# Patient Record
Sex: Female | Born: 1950 | Race: White | Hispanic: No | Marital: Married | Smoking: Current every day smoker
Health system: Southern US, Community
[De-identification: ages and names within clinical notes are randomized; demographics above are authoritative.]

## PROBLEM LIST (undated history)

## (undated) ENCOUNTER — Ambulatory Visit

## (undated) DIAGNOSIS — Z9889 Other specified postprocedural states: Secondary | ICD-10-CM

## (undated) DIAGNOSIS — M810 Age-related osteoporosis without current pathological fracture: Secondary | ICD-10-CM

## (undated) DIAGNOSIS — F3341 Major depressive disorder, recurrent, in partial remission: Secondary | ICD-10-CM

## (undated) DIAGNOSIS — Z961 Presence of intraocular lens: Secondary | ICD-10-CM

## (undated) DIAGNOSIS — C50919 Malignant neoplasm of unspecified site of unspecified female breast: Secondary | ICD-10-CM

## (undated) DIAGNOSIS — E782 Mixed hyperlipidemia: Secondary | ICD-10-CM

## (undated) DIAGNOSIS — H3530 Unspecified macular degeneration: Secondary | ICD-10-CM

## (undated) DIAGNOSIS — E8881 Metabolic syndrome: Secondary | ICD-10-CM

## (undated) DIAGNOSIS — E559 Vitamin D deficiency, unspecified: Secondary | ICD-10-CM

## (undated) DIAGNOSIS — H17813 Minor opacity of cornea, bilateral: Secondary | ICD-10-CM

## (undated) DIAGNOSIS — K219 Gastro-esophageal reflux disease without esophagitis: Secondary | ICD-10-CM

## (undated) DIAGNOSIS — Z1379 Encounter for other screening for genetic and chromosomal anomalies: Secondary | ICD-10-CM

## (undated) DIAGNOSIS — K580 Irritable bowel syndrome with diarrhea: Secondary | ICD-10-CM

## (undated) DIAGNOSIS — Z720 Tobacco use: Secondary | ICD-10-CM

## (undated) HISTORY — PX: ABDOMINAL HYSTERECTOMY: SHX81

## (undated) HISTORY — DX: Major depressive disorder, recurrent, in partial remission: F33.41

## (undated) HISTORY — DX: Gastro-esophageal reflux disease without esophagitis: K21.9

## (undated) HISTORY — DX: Malignant neoplasm of unspecified site of unspecified female breast: C50.919

## (undated) HISTORY — DX: Metabolic syndrome: E88.810

## (undated) HISTORY — DX: Age-related osteoporosis without current pathological fracture: M81.0

## (undated) HISTORY — DX: Irritable bowel syndrome with diarrhea: K58.0

## (undated) HISTORY — DX: Mixed hyperlipidemia: E78.2

## (undated) HISTORY — DX: Metabolic syndrome: E88.81

## (undated) HISTORY — DX: Vitamin D deficiency, unspecified: E55.9

## (undated) HISTORY — DX: Tobacco use: Z72.0

---
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
## 1994-03-21 HISTORY — PX: BREAST LUMPECTOMY: SHX2

## 2001-03-21 HISTORY — PX: APPENDECTOMY: SHX54

## 2004-08-31 HISTORY — PX: DESCEMETS STRIPPING AUTOMATED ENDOTHELIAL KERATOPLASTY: SHX1454

## 2004-12-14 HISTORY — PX: DESCEMETS STRIPPING AUTOMATED ENDOTHELIAL KERATOPLASTY: SHX1454

## 2007-03-22 DIAGNOSIS — C50919 Malignant neoplasm of unspecified site of unspecified female breast: Secondary | ICD-10-CM

## 2007-03-22 HISTORY — DX: Malignant neoplasm of unspecified site of unspecified female breast: C50.919

## 2014-05-15 ENCOUNTER — Telehealth: Payer: Self-pay | Admitting: Genetic Counselor

## 2014-05-15 NOTE — Telephone Encounter (Signed)
Pt wants to confirm that insurance will cover testing before scheduling and will call back to schedule appt.

## 2014-06-11 ENCOUNTER — Ambulatory Visit (HOSPITAL_BASED_OUTPATIENT_CLINIC_OR_DEPARTMENT_OTHER): Payer: 59 | Admitting: Genetic Counselor

## 2014-06-11 ENCOUNTER — Other Ambulatory Visit: Payer: 59

## 2014-06-11 ENCOUNTER — Encounter: Payer: Self-pay | Admitting: Genetic Counselor

## 2014-06-11 DIAGNOSIS — C50919 Malignant neoplasm of unspecified site of unspecified female breast: Secondary | ICD-10-CM | POA: Insufficient documentation

## 2014-06-11 DIAGNOSIS — Z800 Family history of malignant neoplasm of digestive organs: Secondary | ICD-10-CM

## 2014-06-11 DIAGNOSIS — Z8041 Family history of malignant neoplasm of ovary: Secondary | ICD-10-CM | POA: Diagnosis not present

## 2014-06-11 DIAGNOSIS — Z853 Personal history of malignant neoplasm of breast: Secondary | ICD-10-CM

## 2014-06-11 DIAGNOSIS — Z803 Family history of malignant neoplasm of breast: Secondary | ICD-10-CM

## 2014-06-11 DIAGNOSIS — Z315 Encounter for genetic counseling: Secondary | ICD-10-CM

## 2014-06-11 NOTE — Progress Notes (Signed)
REFERRING PROVIDER: Hosie Poisson, MD  PRIMARY PROVIDER:  No primary care provider on file.  PRIMARY REASON FOR VISIT:  1. History of breast cancer   2. Family history of breast cancer   3. Family history of ovarian cancer   4. Family history of colon cancer      HISTORY OF PRESENT ILLNESS:   Rachel Mcgee, a 64 y.o. female, was seen for a [place] [place] cancer genetics consultation at the request of Dr. Hinton Rao due to a personal and family history of cancer.  Rachel Mcgee presents to clinic today to discuss the possibility of a hereditary predisposition to cancer, genetic testing, and to further clarify her future cancer risks, as well as potential cancer risks for family members.   In 2008, at the age of 64, Rachel Mcgee was diagnosed with invasive ductal carcinoma of the breast.  The tumor was ER+/PR-/Her2+. This was treated with surgery, chemotherapy and radiation.  Nobody in the family is reported to have had genetic testing.    CANCER HISTORY:   No history exists.     HORMONAL Mcgee FACTORS:  Menarche was at age 10.  First live birth at age 41.  OCP use for approximately 5-10 years.  Ovaries intact: yes.  Hysterectomy: no.  Menopausal status: postmenopausal.  HRT use: 0 years. Colonoscopy: yes; normal. Mammogram within the last year: yes. Number of breast biopsies: 1. Up to date with pelvic exams:  yes. Any excessive radiation exposure in the past:  Previous breast cancer treatment.  Past Medical History  Diagnosis Date  . Breast cancer 2009    Past Surgical History  Procedure Laterality Date  . Abdominal hysterectomy      partial hysterectomy at age 89    History   Social History  . Marital Status: Married    Spouse Name: N/A  . Number of Children: N/A  . Years of Education: N/A   Social History Main Topics  . Smoking status: Former Smoker -- 1.00 packs/day for 30 years    Types: Cigarettes  . Smokeless tobacco: Not on file  . Alcohol Use: No  . Drug  Use: No  . Sexual Activity: Not on file   Other Topics Concern  . None   Social History Narrative  . None     FAMILY HISTORY:  We obtained a detailed, 4-generation family history.  Significant diagnoses are listed below: Family History  Problem Relation Age of Onset  . Stroke Father   . Ovarian cancer Sister 74  . Breast cancer Maternal Aunt     dx in her 62s  . Colon cancer Maternal Uncle     dx in late 92s  . Ovarian cancer Paternal Aunt     dx in her 18s  . Melanoma Paternal Uncle   . Stroke Maternal Grandmother   . Colon cancer Paternal Grandmother 106  . Brain cancer Paternal Uncle   . Breast cancer Cousin 30  . Ovarian cancer Cousin     dx in her 63s   The patient has one son who is healthy, and a sister who was diagnosed with ovarian cancer at age 39.  Rachel Mcgee mother died at 97 with dementia, and her father is alive at 41.  He had two sisters and a brother.  One sister had ovarian cancer, one sister had a benign brain tumor, and the brother had melanoma.  A paternal cousin had breast cancer at age 53 and died of ovarian cancer in her 63s.  [name]'s paternal grandmother had colon cancer.  Rachel Mcgee's mother had three brothers and two sisters.  One brother had colon cancer in his 20s and one sister had breast cancer.  The brother had a daughter with breast cancer in her 46s.   Patient's maternal ancestors are of Caucasian descent, and paternal ancestors are of Caucasian descent. There is no reported Ashkenazi Jewish ancestry. There is no known consanguinity.  GENETIC COUNSELING ASSESSMENT: Rachel Mcgee is a 64 y.o. female with a personal and family history of cancer which somewhat suggestive of a hereditary cancer syndrome and predisposition to cancer. We, therefore, discussed and recommended the following at today's visit.   DISCUSSION: We reviewed the characteristics, features and inheritance patterns of hereditary cancer syndromes. Hereditary cancer  syndromes were reviewed, including BRCA mutation, CHEK2 mutations, as well as other hereditary genes associated with both breast and ovarian cancer syndromes.  We also discussed genetic testing, including the appropriate family members to test, the process of testing, insurance coverage and turn-around-time for results. We discussed the implications of a negative, positive and/or variant of uncertain significant result.   Based on Rachel Mcgee's family history, if she is negative we could be reassuring that she would not be at increased Mcgee for other cancers based on the testing of these genes.  However, there is a rare chance that she is a phenocopy, and therefore her sister should consider testing, regardless of Rachel Mcgee's results.  We recommended Rachel Mcgee pursue genetic testing for the Breast/Ovarian cancer gene panel. The Breast/Ovarian gene panel offered by [facility] includes sequencing and rearrangement analysis for the following 21 genes:  ATM, BARD1, BRCA1, BRCA2, BRIP1, CDH1, CHEK2, EPCAM, FANCC, MLH1, MSH2, MSH6, NBN, PALB2, PMS2, PTEN, RAD51C, RAD51D, STK11, TP53, and XRCC2.     PLAN: After considering the risks, benefits, and limitations, Rachel Mcgee  provided informed consent to pursue genetic testing and the blood sample was sent to [facility] for analysis of the Breast/Ovarian Cancer gene panel. Results should be available within approximately 2-3 weeks' time, at which point they will be disclosed by telephone to Rachel Mcgee, as will any additional recommendations warranted by these results. Rachel Mcgee will receive a summary of her genetic counseling visit and a copy of her results once available. This information will also be available in Epic. We encouraged Rachel Mcgee to remain in contact with cancer genetics annually so that we can continuously update the family history and inform her of any changes in cancer genetics and testing that may be of benefit for her family. [name] questions were answered to her satisfaction today. Our contact information was provided should additional questions or concerns arise.  Based on Rachel Mcgee's family history, we recommended her sister, who was diagnosed with ovarian cancer at age 63, have genetic counseling and testing. Rachel Mcgee will let us know if we can be of any assistance in coordinating genetic counseling and/or testing for this family member.   Lastly, we encouraged Rachel Mcgee to remain in contact with cancer genetics annually so that we can continuously update the family history and inform her of any changes in cancer genetics and testing that may be of benefit for this family.   Ms.  Mceachern questions were answered to her satisfaction today. Our contact information was provided should additional questions or concerns arise. Thank you for the referral and allowing [place] to share in the care of your patient.   Roderick Calo P. Florene Glen, MS, [facility] Certified Dietitian  [email or web address] phone: [phone]  The patient was seen for a total of 60 minutes in face-to-face genetic counseling.  This patient was discussed with Drs. Magrinat, Lindi Adie and/or Burr Medico who agrees with the above.    _______________________________________________________________________ For Office Staff:  Number of people involved in session: 2 Was an Intern/ student involved with case: yes

## 2014-06-23 ENCOUNTER — Telehealth: Payer: Self-pay | Admitting: Genetic Counselor

## 2014-06-23 ENCOUNTER — Encounter: Payer: Self-pay | Admitting: Genetic Counselor

## 2014-06-23 DIAGNOSIS — Z1379 Encounter for other screening for genetic and chromosomal anomalies: Secondary | ICD-10-CM

## 2014-06-23 HISTORY — DX: Encounter for other screening for genetic and chromosomal anomalies: Z13.79

## 2014-06-23 NOTE — Telephone Encounter (Signed)
Revealed negative genetic testing on Breast/Ovarian cancer panel

## 2015-05-12 DIAGNOSIS — Z1231 Encounter for screening mammogram for malignant neoplasm of breast: Secondary | ICD-10-CM | POA: Diagnosis not present

## 2015-05-20 DIAGNOSIS — M810 Age-related osteoporosis without current pathological fracture: Secondary | ICD-10-CM | POA: Diagnosis not present

## 2015-05-20 DIAGNOSIS — Z803 Family history of malignant neoplasm of breast: Secondary | ICD-10-CM | POA: Diagnosis not present

## 2015-05-20 DIAGNOSIS — M5416 Radiculopathy, lumbar region: Secondary | ICD-10-CM | POA: Diagnosis not present

## 2015-05-20 DIAGNOSIS — Z853 Personal history of malignant neoplasm of breast: Secondary | ICD-10-CM | POA: Diagnosis not present

## 2015-05-20 DIAGNOSIS — M545 Low back pain: Secondary | ICD-10-CM | POA: Diagnosis not present

## 2015-05-20 DIAGNOSIS — Z8041 Family history of malignant neoplasm of ovary: Secondary | ICD-10-CM

## 2015-05-26 DIAGNOSIS — Z853 Personal history of malignant neoplasm of breast: Secondary | ICD-10-CM | POA: Diagnosis not present

## 2015-05-26 DIAGNOSIS — M5126 Other intervertebral disc displacement, lumbar region: Secondary | ICD-10-CM | POA: Diagnosis not present

## 2015-05-29 DIAGNOSIS — Z853 Personal history of malignant neoplasm of breast: Secondary | ICD-10-CM | POA: Diagnosis not present

## 2015-05-29 DIAGNOSIS — M810 Age-related osteoporosis without current pathological fracture: Secondary | ICD-10-CM | POA: Diagnosis not present

## 2015-09-09 DIAGNOSIS — H524 Presbyopia: Secondary | ICD-10-CM | POA: Diagnosis not present

## 2015-09-09 DIAGNOSIS — H52223 Regular astigmatism, bilateral: Secondary | ICD-10-CM | POA: Diagnosis not present

## 2015-09-09 DIAGNOSIS — H2513 Age-related nuclear cataract, bilateral: Secondary | ICD-10-CM | POA: Diagnosis not present

## 2016-03-07 DIAGNOSIS — Z853 Personal history of malignant neoplasm of breast: Secondary | ICD-10-CM | POA: Diagnosis not present

## 2016-03-07 DIAGNOSIS — Z1389 Encounter for screening for other disorder: Secondary | ICD-10-CM | POA: Diagnosis not present

## 2016-03-07 DIAGNOSIS — Z9181 History of falling: Secondary | ICD-10-CM | POA: Diagnosis not present

## 2016-03-07 DIAGNOSIS — Z0000 Encounter for general adult medical examination without abnormal findings: Secondary | ICD-10-CM | POA: Diagnosis not present

## 2016-03-07 DIAGNOSIS — Z1211 Encounter for screening for malignant neoplasm of colon: Secondary | ICD-10-CM | POA: Diagnosis not present

## 2016-05-02 DIAGNOSIS — Z79899 Other long term (current) drug therapy: Secondary | ICD-10-CM | POA: Diagnosis not present

## 2016-05-02 DIAGNOSIS — K219 Gastro-esophageal reflux disease without esophagitis: Secondary | ICD-10-CM | POA: Diagnosis not present

## 2016-05-09 DIAGNOSIS — Z720 Tobacco use: Secondary | ICD-10-CM | POA: Diagnosis not present

## 2016-05-09 DIAGNOSIS — M810 Age-related osteoporosis without current pathological fracture: Secondary | ICD-10-CM | POA: Diagnosis not present

## 2016-05-09 DIAGNOSIS — K219 Gastro-esophageal reflux disease without esophagitis: Secondary | ICD-10-CM | POA: Diagnosis not present

## 2016-05-09 DIAGNOSIS — E782 Mixed hyperlipidemia: Secondary | ICD-10-CM | POA: Diagnosis not present

## 2016-05-09 DIAGNOSIS — F410 Panic disorder [episodic paroxysmal anxiety] without agoraphobia: Secondary | ICD-10-CM | POA: Diagnosis not present

## 2016-05-09 DIAGNOSIS — E8881 Metabolic syndrome: Secondary | ICD-10-CM | POA: Diagnosis not present

## 2016-05-23 DIAGNOSIS — Z853 Personal history of malignant neoplasm of breast: Secondary | ICD-10-CM | POA: Diagnosis not present

## 2016-05-31 DIAGNOSIS — Z86010 Personal history of colonic polyps: Secondary | ICD-10-CM | POA: Diagnosis not present

## 2016-05-31 DIAGNOSIS — Z1211 Encounter for screening for malignant neoplasm of colon: Secondary | ICD-10-CM | POA: Diagnosis not present

## 2016-06-06 DIAGNOSIS — M810 Age-related osteoporosis without current pathological fracture: Secondary | ICD-10-CM | POA: Diagnosis not present

## 2016-06-06 DIAGNOSIS — Z1231 Encounter for screening mammogram for malignant neoplasm of breast: Secondary | ICD-10-CM | POA: Diagnosis not present

## 2016-06-06 DIAGNOSIS — M8589 Other specified disorders of bone density and structure, multiple sites: Secondary | ICD-10-CM | POA: Diagnosis not present

## 2016-07-18 DIAGNOSIS — K648 Other hemorrhoids: Secondary | ICD-10-CM | POA: Diagnosis not present

## 2016-07-18 DIAGNOSIS — D122 Benign neoplasm of ascending colon: Secondary | ICD-10-CM | POA: Diagnosis not present

## 2016-07-18 DIAGNOSIS — Z853 Personal history of malignant neoplasm of breast: Secondary | ICD-10-CM | POA: Diagnosis not present

## 2016-07-18 DIAGNOSIS — Z800 Family history of malignant neoplasm of digestive organs: Secondary | ICD-10-CM | POA: Diagnosis not present

## 2016-07-18 DIAGNOSIS — D123 Benign neoplasm of transverse colon: Secondary | ICD-10-CM | POA: Diagnosis not present

## 2016-07-18 DIAGNOSIS — K5730 Diverticulosis of large intestine without perforation or abscess without bleeding: Secondary | ICD-10-CM | POA: Diagnosis not present

## 2016-07-18 DIAGNOSIS — Z1211 Encounter for screening for malignant neoplasm of colon: Secondary | ICD-10-CM | POA: Diagnosis not present

## 2016-07-18 DIAGNOSIS — K635 Polyp of colon: Secondary | ICD-10-CM | POA: Diagnosis not present

## 2016-07-18 DIAGNOSIS — D128 Benign neoplasm of rectum: Secondary | ICD-10-CM | POA: Diagnosis not present

## 2016-07-18 DIAGNOSIS — F17200 Nicotine dependence, unspecified, uncomplicated: Secondary | ICD-10-CM | POA: Diagnosis not present

## 2016-07-18 HISTORY — PX: COLONOSCOPY W/ POLYPECTOMY: SHX1380

## 2016-09-01 DIAGNOSIS — M810 Age-related osteoporosis without current pathological fracture: Secondary | ICD-10-CM | POA: Diagnosis not present

## 2016-09-01 DIAGNOSIS — Z720 Tobacco use: Secondary | ICD-10-CM | POA: Diagnosis not present

## 2016-09-01 DIAGNOSIS — F410 Panic disorder [episodic paroxysmal anxiety] without agoraphobia: Secondary | ICD-10-CM | POA: Diagnosis not present

## 2016-09-01 DIAGNOSIS — E782 Mixed hyperlipidemia: Secondary | ICD-10-CM | POA: Diagnosis not present

## 2016-09-01 DIAGNOSIS — K219 Gastro-esophageal reflux disease without esophagitis: Secondary | ICD-10-CM | POA: Diagnosis not present

## 2016-09-01 DIAGNOSIS — E8881 Metabolic syndrome: Secondary | ICD-10-CM | POA: Diagnosis not present

## 2016-09-01 DIAGNOSIS — Z6825 Body mass index (BMI) 25.0-25.9, adult: Secondary | ICD-10-CM | POA: Diagnosis not present

## 2016-09-01 DIAGNOSIS — Z1159 Encounter for screening for other viral diseases: Secondary | ICD-10-CM | POA: Diagnosis not present

## 2016-09-01 DIAGNOSIS — K580 Irritable bowel syndrome with diarrhea: Secondary | ICD-10-CM | POA: Diagnosis not present

## 2016-10-03 DIAGNOSIS — M79671 Pain in right foot: Secondary | ICD-10-CM | POA: Diagnosis not present

## 2017-02-20 DIAGNOSIS — E559 Vitamin D deficiency, unspecified: Secondary | ICD-10-CM | POA: Diagnosis not present

## 2017-02-20 DIAGNOSIS — Z1339 Encounter for screening examination for other mental health and behavioral disorders: Secondary | ICD-10-CM | POA: Diagnosis not present

## 2017-02-20 DIAGNOSIS — E8881 Metabolic syndrome: Secondary | ICD-10-CM | POA: Diagnosis not present

## 2017-02-20 DIAGNOSIS — K219 Gastro-esophageal reflux disease without esophagitis: Secondary | ICD-10-CM | POA: Diagnosis not present

## 2017-02-20 DIAGNOSIS — Z6827 Body mass index (BMI) 27.0-27.9, adult: Secondary | ICD-10-CM | POA: Diagnosis not present

## 2017-02-20 DIAGNOSIS — L570 Actinic keratosis: Secondary | ICD-10-CM | POA: Diagnosis not present

## 2017-02-20 DIAGNOSIS — K580 Irritable bowel syndrome with diarrhea: Secondary | ICD-10-CM | POA: Diagnosis not present

## 2017-02-20 DIAGNOSIS — Z79899 Other long term (current) drug therapy: Secondary | ICD-10-CM | POA: Diagnosis not present

## 2017-02-20 DIAGNOSIS — M810 Age-related osteoporosis without current pathological fracture: Secondary | ICD-10-CM | POA: Diagnosis not present

## 2017-02-20 DIAGNOSIS — Z720 Tobacco use: Secondary | ICD-10-CM | POA: Diagnosis not present

## 2017-02-20 DIAGNOSIS — E782 Mixed hyperlipidemia: Secondary | ICD-10-CM | POA: Diagnosis not present

## 2017-02-20 DIAGNOSIS — F3341 Major depressive disorder, recurrent, in partial remission: Secondary | ICD-10-CM | POA: Diagnosis not present

## 2017-05-03 DIAGNOSIS — H524 Presbyopia: Secondary | ICD-10-CM | POA: Diagnosis not present

## 2017-05-03 DIAGNOSIS — H5213 Myopia, bilateral: Secondary | ICD-10-CM | POA: Diagnosis not present

## 2017-05-03 DIAGNOSIS — H52223 Regular astigmatism, bilateral: Secondary | ICD-10-CM | POA: Diagnosis not present

## 2017-05-08 DIAGNOSIS — H353131 Nonexudative age-related macular degeneration, bilateral, early dry stage: Secondary | ICD-10-CM | POA: Diagnosis not present

## 2017-05-08 DIAGNOSIS — Z9889 Other specified postprocedural states: Secondary | ICD-10-CM

## 2017-05-08 DIAGNOSIS — H3530 Unspecified macular degeneration: Secondary | ICD-10-CM | POA: Diagnosis not present

## 2017-05-08 DIAGNOSIS — H25813 Combined forms of age-related cataract, bilateral: Secondary | ICD-10-CM | POA: Diagnosis not present

## 2017-05-08 HISTORY — DX: Other specified postprocedural states: Z98.890

## 2017-05-16 DIAGNOSIS — C44519 Basal cell carcinoma of skin of other part of trunk: Secondary | ICD-10-CM | POA: Diagnosis not present

## 2017-05-16 DIAGNOSIS — L570 Actinic keratosis: Secondary | ICD-10-CM | POA: Diagnosis not present

## 2017-05-16 DIAGNOSIS — L814 Other melanin hyperpigmentation: Secondary | ICD-10-CM | POA: Diagnosis not present

## 2017-05-16 DIAGNOSIS — D485 Neoplasm of uncertain behavior of skin: Secondary | ICD-10-CM | POA: Diagnosis not present

## 2017-05-16 DIAGNOSIS — D225 Melanocytic nevi of trunk: Secondary | ICD-10-CM | POA: Diagnosis not present

## 2017-05-16 DIAGNOSIS — L821 Other seborrheic keratosis: Secondary | ICD-10-CM | POA: Diagnosis not present

## 2017-05-30 DIAGNOSIS — Z961 Presence of intraocular lens: Secondary | ICD-10-CM

## 2017-05-30 DIAGNOSIS — H25813 Combined forms of age-related cataract, bilateral: Secondary | ICD-10-CM | POA: Diagnosis not present

## 2017-05-30 DIAGNOSIS — Z9849 Cataract extraction status, unspecified eye: Secondary | ICD-10-CM

## 2017-05-30 DIAGNOSIS — H25812 Combined forms of age-related cataract, left eye: Secondary | ICD-10-CM | POA: Diagnosis not present

## 2017-05-30 DIAGNOSIS — H5789 Other specified disorders of eye and adnexa: Secondary | ICD-10-CM | POA: Diagnosis not present

## 2017-05-30 HISTORY — PX: CATARACT EXTRACTION W/ INTRAOCULAR LENS IMPLANT: SHX1309

## 2017-05-30 HISTORY — DX: Presence of intraocular lens: Z96.1

## 2017-05-30 HISTORY — DX: Cataract extraction status, unspecified eye: Z98.49

## 2017-05-31 DIAGNOSIS — Z9889 Other specified postprocedural states: Secondary | ICD-10-CM | POA: Diagnosis not present

## 2017-05-31 DIAGNOSIS — H25811 Combined forms of age-related cataract, right eye: Secondary | ICD-10-CM | POA: Diagnosis not present

## 2017-05-31 DIAGNOSIS — Z961 Presence of intraocular lens: Secondary | ICD-10-CM | POA: Diagnosis not present

## 2017-05-31 DIAGNOSIS — Z48810 Encounter for surgical aftercare following surgery on the sense organs: Secondary | ICD-10-CM | POA: Diagnosis not present

## 2017-05-31 DIAGNOSIS — Z9842 Cataract extraction status, left eye: Secondary | ICD-10-CM | POA: Diagnosis not present

## 2017-06-13 DIAGNOSIS — H25811 Combined forms of age-related cataract, right eye: Secondary | ICD-10-CM | POA: Diagnosis not present

## 2017-06-13 HISTORY — PX: CATARACT EXTRACTION W/ INTRAOCULAR LENS IMPLANT: SHX1309

## 2017-06-14 DIAGNOSIS — Z9842 Cataract extraction status, left eye: Secondary | ICD-10-CM | POA: Diagnosis not present

## 2017-06-14 DIAGNOSIS — Z48810 Encounter for surgical aftercare following surgery on the sense organs: Secondary | ICD-10-CM | POA: Diagnosis not present

## 2017-06-14 DIAGNOSIS — Z9841 Cataract extraction status, right eye: Secondary | ICD-10-CM | POA: Diagnosis not present

## 2017-06-14 DIAGNOSIS — H3530 Unspecified macular degeneration: Secondary | ICD-10-CM | POA: Diagnosis not present

## 2017-06-14 DIAGNOSIS — Z961 Presence of intraocular lens: Secondary | ICD-10-CM | POA: Diagnosis not present

## 2017-06-28 DIAGNOSIS — Z9841 Cataract extraction status, right eye: Secondary | ICD-10-CM | POA: Diagnosis not present

## 2017-06-28 DIAGNOSIS — Z9842 Cataract extraction status, left eye: Secondary | ICD-10-CM | POA: Diagnosis not present

## 2017-06-28 DIAGNOSIS — Z48810 Encounter for surgical aftercare following surgery on the sense organs: Secondary | ICD-10-CM | POA: Diagnosis not present

## 2017-06-28 DIAGNOSIS — H3530 Unspecified macular degeneration: Secondary | ICD-10-CM | POA: Diagnosis not present

## 2017-06-28 DIAGNOSIS — Z961 Presence of intraocular lens: Secondary | ICD-10-CM | POA: Diagnosis not present

## 2017-06-29 DIAGNOSIS — C44519 Basal cell carcinoma of skin of other part of trunk: Secondary | ICD-10-CM | POA: Diagnosis not present

## 2017-06-29 DIAGNOSIS — L905 Scar conditions and fibrosis of skin: Secondary | ICD-10-CM | POA: Diagnosis not present

## 2017-08-03 DIAGNOSIS — J069 Acute upper respiratory infection, unspecified: Secondary | ICD-10-CM | POA: Diagnosis not present

## 2017-08-28 DIAGNOSIS — F3341 Major depressive disorder, recurrent, in partial remission: Secondary | ICD-10-CM | POA: Diagnosis not present

## 2017-08-28 DIAGNOSIS — Z9181 History of falling: Secondary | ICD-10-CM | POA: Diagnosis not present

## 2017-08-28 DIAGNOSIS — Z6827 Body mass index (BMI) 27.0-27.9, adult: Secondary | ICD-10-CM | POA: Diagnosis not present

## 2017-08-30 DIAGNOSIS — Z961 Presence of intraocular lens: Secondary | ICD-10-CM | POA: Diagnosis not present

## 2017-08-30 DIAGNOSIS — H3530 Unspecified macular degeneration: Secondary | ICD-10-CM | POA: Diagnosis not present

## 2017-08-30 DIAGNOSIS — Z48810 Encounter for surgical aftercare following surgery on the sense organs: Secondary | ICD-10-CM | POA: Diagnosis not present

## 2017-08-30 DIAGNOSIS — Z9841 Cataract extraction status, right eye: Secondary | ICD-10-CM | POA: Diagnosis not present

## 2017-08-30 DIAGNOSIS — Z9889 Other specified postprocedural states: Secondary | ICD-10-CM | POA: Diagnosis not present

## 2017-08-30 DIAGNOSIS — Z9842 Cataract extraction status, left eye: Secondary | ICD-10-CM | POA: Diagnosis not present

## 2017-08-31 DIAGNOSIS — E559 Vitamin D deficiency, unspecified: Secondary | ICD-10-CM | POA: Diagnosis not present

## 2017-08-31 DIAGNOSIS — E782 Mixed hyperlipidemia: Secondary | ICD-10-CM | POA: Diagnosis not present

## 2017-08-31 DIAGNOSIS — Z79899 Other long term (current) drug therapy: Secondary | ICD-10-CM | POA: Diagnosis not present

## 2017-10-16 DIAGNOSIS — Z0000 Encounter for general adult medical examination without abnormal findings: Secondary | ICD-10-CM | POA: Diagnosis not present

## 2017-10-18 DIAGNOSIS — H26491 Other secondary cataract, right eye: Secondary | ICD-10-CM | POA: Diagnosis not present

## 2017-10-18 DIAGNOSIS — H3530 Unspecified macular degeneration: Secondary | ICD-10-CM

## 2017-10-18 DIAGNOSIS — Z9841 Cataract extraction status, right eye: Secondary | ICD-10-CM | POA: Diagnosis not present

## 2017-10-18 DIAGNOSIS — Z48810 Encounter for surgical aftercare following surgery on the sense organs: Secondary | ICD-10-CM | POA: Diagnosis not present

## 2017-10-18 DIAGNOSIS — H26493 Other secondary cataract, bilateral: Secondary | ICD-10-CM | POA: Diagnosis not present

## 2017-10-18 DIAGNOSIS — H353132 Nonexudative age-related macular degeneration, bilateral, intermediate dry stage: Secondary | ICD-10-CM | POA: Diagnosis not present

## 2017-10-18 DIAGNOSIS — H17813 Minor opacity of cornea, bilateral: Secondary | ICD-10-CM

## 2017-10-18 DIAGNOSIS — H2640 Unspecified secondary cataract: Secondary | ICD-10-CM | POA: Diagnosis not present

## 2017-10-18 DIAGNOSIS — Z9889 Other specified postprocedural states: Secondary | ICD-10-CM | POA: Diagnosis not present

## 2017-10-18 DIAGNOSIS — Z961 Presence of intraocular lens: Secondary | ICD-10-CM | POA: Diagnosis not present

## 2017-10-18 DIAGNOSIS — Z9842 Cataract extraction status, left eye: Secondary | ICD-10-CM | POA: Diagnosis not present

## 2017-10-18 HISTORY — DX: Unspecified macular degeneration: H35.30

## 2017-10-18 HISTORY — DX: Minor opacity of cornea, bilateral: H17.813

## 2017-11-28 DIAGNOSIS — Z9889 Other specified postprocedural states: Secondary | ICD-10-CM | POA: Insufficient documentation

## 2017-11-28 HISTORY — DX: Other specified postprocedural states: Z98.890

## 2017-11-29 DIAGNOSIS — Z9889 Other specified postprocedural states: Secondary | ICD-10-CM | POA: Diagnosis not present

## 2017-11-29 DIAGNOSIS — H3530 Unspecified macular degeneration: Secondary | ICD-10-CM | POA: Diagnosis not present

## 2017-11-29 DIAGNOSIS — H2640 Unspecified secondary cataract: Secondary | ICD-10-CM | POA: Diagnosis not present

## 2017-11-29 DIAGNOSIS — Z79899 Other long term (current) drug therapy: Secondary | ICD-10-CM | POA: Diagnosis not present

## 2017-11-29 DIAGNOSIS — Z961 Presence of intraocular lens: Secondary | ICD-10-CM | POA: Diagnosis not present

## 2017-12-27 DIAGNOSIS — Z9842 Cataract extraction status, left eye: Secondary | ICD-10-CM | POA: Diagnosis not present

## 2017-12-27 DIAGNOSIS — Z961 Presence of intraocular lens: Secondary | ICD-10-CM | POA: Diagnosis not present

## 2017-12-27 DIAGNOSIS — Z947 Corneal transplant status: Secondary | ICD-10-CM | POA: Diagnosis not present

## 2017-12-27 DIAGNOSIS — H353131 Nonexudative age-related macular degeneration, bilateral, early dry stage: Secondary | ICD-10-CM | POA: Diagnosis not present

## 2017-12-27 DIAGNOSIS — Z9889 Other specified postprocedural states: Secondary | ICD-10-CM | POA: Diagnosis not present

## 2017-12-27 DIAGNOSIS — H17813 Minor opacity of cornea, bilateral: Secondary | ICD-10-CM | POA: Diagnosis not present

## 2017-12-27 DIAGNOSIS — Z9841 Cataract extraction status, right eye: Secondary | ICD-10-CM | POA: Diagnosis not present

## 2017-12-27 DIAGNOSIS — Z9849 Cataract extraction status, unspecified eye: Secondary | ICD-10-CM | POA: Diagnosis not present

## 2017-12-27 DIAGNOSIS — H3530 Unspecified macular degeneration: Secondary | ICD-10-CM | POA: Diagnosis not present

## 2018-01-24 DIAGNOSIS — H17813 Minor opacity of cornea, bilateral: Secondary | ICD-10-CM | POA: Diagnosis not present

## 2018-01-24 DIAGNOSIS — H3530 Unspecified macular degeneration: Secondary | ICD-10-CM | POA: Diagnosis not present

## 2018-01-24 DIAGNOSIS — H353131 Nonexudative age-related macular degeneration, bilateral, early dry stage: Secondary | ICD-10-CM | POA: Diagnosis not present

## 2018-01-24 DIAGNOSIS — Z9889 Other specified postprocedural states: Secondary | ICD-10-CM | POA: Diagnosis not present

## 2018-01-24 DIAGNOSIS — Z9849 Cataract extraction status, unspecified eye: Secondary | ICD-10-CM | POA: Diagnosis not present

## 2018-01-24 DIAGNOSIS — Z947 Corneal transplant status: Secondary | ICD-10-CM | POA: Diagnosis not present

## 2018-01-24 DIAGNOSIS — Z961 Presence of intraocular lens: Secondary | ICD-10-CM | POA: Diagnosis not present

## 2018-02-28 DIAGNOSIS — H3530 Unspecified macular degeneration: Secondary | ICD-10-CM | POA: Diagnosis not present

## 2018-02-28 DIAGNOSIS — Z9849 Cataract extraction status, unspecified eye: Secondary | ICD-10-CM | POA: Diagnosis not present

## 2018-02-28 DIAGNOSIS — Z9842 Cataract extraction status, left eye: Secondary | ICD-10-CM | POA: Diagnosis not present

## 2018-02-28 DIAGNOSIS — Z9889 Other specified postprocedural states: Secondary | ICD-10-CM | POA: Diagnosis not present

## 2018-02-28 DIAGNOSIS — H353131 Nonexudative age-related macular degeneration, bilateral, early dry stage: Secondary | ICD-10-CM | POA: Diagnosis not present

## 2018-02-28 DIAGNOSIS — Z961 Presence of intraocular lens: Secondary | ICD-10-CM | POA: Diagnosis not present

## 2018-02-28 DIAGNOSIS — Z947 Corneal transplant status: Secondary | ICD-10-CM | POA: Diagnosis not present

## 2018-02-28 DIAGNOSIS — Z9841 Cataract extraction status, right eye: Secondary | ICD-10-CM | POA: Diagnosis not present

## 2018-02-28 DIAGNOSIS — H17813 Minor opacity of cornea, bilateral: Secondary | ICD-10-CM | POA: Diagnosis not present

## 2018-03-28 DIAGNOSIS — J040 Acute laryngitis: Secondary | ICD-10-CM | POA: Diagnosis not present

## 2018-05-09 DIAGNOSIS — H17813 Minor opacity of cornea, bilateral: Secondary | ICD-10-CM | POA: Diagnosis not present

## 2018-05-09 DIAGNOSIS — Z9842 Cataract extraction status, left eye: Secondary | ICD-10-CM | POA: Diagnosis not present

## 2018-05-09 DIAGNOSIS — Z961 Presence of intraocular lens: Secondary | ICD-10-CM | POA: Diagnosis not present

## 2018-05-09 DIAGNOSIS — H26492 Other secondary cataract, left eye: Secondary | ICD-10-CM | POA: Diagnosis not present

## 2018-05-09 DIAGNOSIS — Z9889 Other specified postprocedural states: Secondary | ICD-10-CM | POA: Diagnosis not present

## 2018-05-09 DIAGNOSIS — H3530 Unspecified macular degeneration: Secondary | ICD-10-CM | POA: Diagnosis not present

## 2018-05-09 DIAGNOSIS — Z9841 Cataract extraction status, right eye: Secondary | ICD-10-CM | POA: Diagnosis not present

## 2018-05-16 DIAGNOSIS — Z85828 Personal history of other malignant neoplasm of skin: Secondary | ICD-10-CM | POA: Diagnosis not present

## 2018-05-16 DIAGNOSIS — D1801 Hemangioma of skin and subcutaneous tissue: Secondary | ICD-10-CM | POA: Diagnosis not present

## 2018-05-16 DIAGNOSIS — L821 Other seborrheic keratosis: Secondary | ICD-10-CM | POA: Diagnosis not present

## 2018-05-16 DIAGNOSIS — L819 Disorder of pigmentation, unspecified: Secondary | ICD-10-CM | POA: Diagnosis not present

## 2018-05-16 DIAGNOSIS — C44319 Basal cell carcinoma of skin of other parts of face: Secondary | ICD-10-CM | POA: Diagnosis not present

## 2018-05-16 DIAGNOSIS — D485 Neoplasm of uncertain behavior of skin: Secondary | ICD-10-CM | POA: Diagnosis not present

## 2018-05-16 DIAGNOSIS — D229 Melanocytic nevi, unspecified: Secondary | ICD-10-CM | POA: Diagnosis not present

## 2018-05-24 DIAGNOSIS — C44319 Basal cell carcinoma of skin of other parts of face: Secondary | ICD-10-CM | POA: Diagnosis not present

## 2018-08-22 DIAGNOSIS — K219 Gastro-esophageal reflux disease without esophagitis: Secondary | ICD-10-CM | POA: Diagnosis not present

## 2018-08-22 DIAGNOSIS — E782 Mixed hyperlipidemia: Secondary | ICD-10-CM | POA: Diagnosis not present

## 2018-08-22 DIAGNOSIS — F3341 Major depressive disorder, recurrent, in partial remission: Secondary | ICD-10-CM | POA: Diagnosis not present

## 2018-08-22 DIAGNOSIS — K580 Irritable bowel syndrome with diarrhea: Secondary | ICD-10-CM | POA: Diagnosis not present

## 2018-08-22 DIAGNOSIS — Z6826 Body mass index (BMI) 26.0-26.9, adult: Secondary | ICD-10-CM | POA: Diagnosis not present

## 2018-09-10 DIAGNOSIS — Z79899 Other long term (current) drug therapy: Secondary | ICD-10-CM | POA: Diagnosis not present

## 2018-09-10 DIAGNOSIS — H26492 Other secondary cataract, left eye: Secondary | ICD-10-CM | POA: Diagnosis not present

## 2018-09-10 DIAGNOSIS — H59031 Cystoid macular edema following cataract surgery, right eye: Secondary | ICD-10-CM | POA: Diagnosis not present

## 2018-09-10 DIAGNOSIS — H3530 Unspecified macular degeneration: Secondary | ICD-10-CM | POA: Diagnosis not present

## 2018-09-10 DIAGNOSIS — H17813 Minor opacity of cornea, bilateral: Secondary | ICD-10-CM | POA: Diagnosis not present

## 2018-09-10 DIAGNOSIS — Z9889 Other specified postprocedural states: Secondary | ICD-10-CM | POA: Diagnosis not present

## 2018-10-04 DIAGNOSIS — H353131 Nonexudative age-related macular degeneration, bilateral, early dry stage: Secondary | ICD-10-CM | POA: Diagnosis not present

## 2018-10-12 DIAGNOSIS — E782 Mixed hyperlipidemia: Secondary | ICD-10-CM | POA: Diagnosis not present

## 2018-10-12 DIAGNOSIS — E8881 Metabolic syndrome: Secondary | ICD-10-CM | POA: Diagnosis not present

## 2018-10-12 DIAGNOSIS — Z79899 Other long term (current) drug therapy: Secondary | ICD-10-CM | POA: Diagnosis not present

## 2018-10-26 DIAGNOSIS — K219 Gastro-esophageal reflux disease without esophagitis: Secondary | ICD-10-CM | POA: Diagnosis not present

## 2018-10-26 DIAGNOSIS — R9431 Abnormal electrocardiogram [ECG] [EKG]: Secondary | ICD-10-CM | POA: Diagnosis not present

## 2018-10-26 DIAGNOSIS — Z720 Tobacco use: Secondary | ICD-10-CM | POA: Diagnosis not present

## 2018-10-26 DIAGNOSIS — Z853 Personal history of malignant neoplasm of breast: Secondary | ICD-10-CM | POA: Diagnosis not present

## 2018-10-26 DIAGNOSIS — Z0000 Encounter for general adult medical examination without abnormal findings: Secondary | ICD-10-CM | POA: Diagnosis not present

## 2018-10-26 DIAGNOSIS — E559 Vitamin D deficiency, unspecified: Secondary | ICD-10-CM | POA: Diagnosis not present

## 2018-10-26 DIAGNOSIS — Z780 Asymptomatic menopausal state: Secondary | ICD-10-CM | POA: Diagnosis not present

## 2018-10-26 DIAGNOSIS — E782 Mixed hyperlipidemia: Secondary | ICD-10-CM | POA: Diagnosis not present

## 2018-10-26 DIAGNOSIS — F3341 Major depressive disorder, recurrent, in partial remission: Secondary | ICD-10-CM | POA: Diagnosis not present

## 2018-10-26 DIAGNOSIS — K580 Irritable bowel syndrome with diarrhea: Secondary | ICD-10-CM | POA: Diagnosis not present

## 2018-10-26 DIAGNOSIS — Z6828 Body mass index (BMI) 28.0-28.9, adult: Secondary | ICD-10-CM | POA: Diagnosis not present

## 2018-10-26 DIAGNOSIS — M810 Age-related osteoporosis without current pathological fracture: Secondary | ICD-10-CM | POA: Diagnosis not present

## 2018-11-06 ENCOUNTER — Other Ambulatory Visit: Payer: Self-pay

## 2018-11-06 ENCOUNTER — Ambulatory Visit (INDEPENDENT_AMBULATORY_CARE_PROVIDER_SITE_OTHER): Payer: PPO | Admitting: Cardiology

## 2018-11-06 ENCOUNTER — Encounter: Payer: Self-pay | Admitting: *Deleted

## 2018-11-06 ENCOUNTER — Encounter: Payer: Self-pay | Admitting: Cardiology

## 2018-11-06 ENCOUNTER — Other Ambulatory Visit (INDEPENDENT_AMBULATORY_CARE_PROVIDER_SITE_OTHER): Payer: PPO

## 2018-11-06 VITALS — BP 160/62 | HR 87 | Ht 64.0 in | Wt 161.0 lb

## 2018-11-06 DIAGNOSIS — E782 Mixed hyperlipidemia: Secondary | ICD-10-CM | POA: Insufficient documentation

## 2018-11-06 DIAGNOSIS — R0789 Other chest pain: Secondary | ICD-10-CM

## 2018-11-06 DIAGNOSIS — R011 Cardiac murmur, unspecified: Secondary | ICD-10-CM | POA: Insufficient documentation

## 2018-11-06 DIAGNOSIS — R079 Chest pain, unspecified: Secondary | ICD-10-CM

## 2018-11-06 DIAGNOSIS — E8881 Metabolic syndrome: Secondary | ICD-10-CM | POA: Insufficient documentation

## 2018-11-06 DIAGNOSIS — R9431 Abnormal electrocardiogram [ECG] [EKG]: Secondary | ICD-10-CM

## 2018-11-06 DIAGNOSIS — F3341 Major depressive disorder, recurrent, in partial remission: Secondary | ICD-10-CM | POA: Insufficient documentation

## 2018-11-06 DIAGNOSIS — K580 Irritable bowel syndrome with diarrhea: Secondary | ICD-10-CM | POA: Insufficient documentation

## 2018-11-06 DIAGNOSIS — Z720 Tobacco use: Secondary | ICD-10-CM | POA: Insufficient documentation

## 2018-11-06 DIAGNOSIS — K219 Gastro-esophageal reflux disease without esophagitis: Secondary | ICD-10-CM | POA: Insufficient documentation

## 2018-11-06 DIAGNOSIS — M810 Age-related osteoporosis without current pathological fracture: Secondary | ICD-10-CM | POA: Insufficient documentation

## 2018-11-06 HISTORY — DX: Cardiac murmur, unspecified: R01.1

## 2018-11-06 HISTORY — DX: Abnormal electrocardiogram (ECG) (EKG): R94.31

## 2018-11-06 HISTORY — DX: Mixed hyperlipidemia: E78.2

## 2018-11-06 HISTORY — DX: Other chest pain: R07.89

## 2018-11-06 NOTE — Progress Notes (Signed)
Cardiology Office Note:    Date:  11/06/2018   ID:  Rachel Mcgee, DOB 1951-01-19, MRN [id number]  PCP:  Serita Grammes, MD  Cardiologist:  [name], MD   Referring MD: Serita Grammes, MD    ASSESSMENT:    1. Chest discomfort   2. Tobacco abuse   3. Hypercholesterolemia with hyperglyceridemia   4. Mixed dyslipidemia   5. Nonspecific abnormal electrocardiogram (ECG) (EKG)    PLAN:    In order of problems listed above:  1. Chest discomfort and abnormal EKG: Primary prevention stressed with the patient.  Importance of compliance with diet and medication stressed and she vocalized understanding.  Her blood pressure is elevated but she has no history of hypertension and I suspect this to be whitecoat hypertension.  In view of her symptoms she will have a Lexiscan sestamibi.  Echocardiogram will be done to assess murmur heard on auscultation.  In view of junctional rhythm found on previous EKG I would like to do a 3-day ZIO monitor to assess this. 2. Mixed dyslipidemia: Diet was discussed and she is on statin therapy by her primary care physician. 3. Cigarette smoking: I spent 5 minutes with the patient discussing solely about smoking. Smoking cessation was counseled. I suggested to the patient also different medications and pharmacological interventions. Patient is keen to try stopping on its own at this time. He will get back to me if he needs any further assistance in this matter. 4. Patient will be seen in follow-up appointment in 3 months or earlier if the patient has any concerns    Medication Adjustments/Labs and Tests Ordered: Current medicines are reviewed at length with the patient today.  Concerns regarding medicines are outlined above.  No orders of the defined types were placed in this encounter.  No orders of the defined types were placed in this encounter.    History of Present Illness:    Rachel Mcgee is a 68 y.o. female who is being seen  today for the evaluation of chest discomfort and abnormal EKG at the request of Serita Grammes, MD.  Patient is a pleasant 68 year old female.  She has past medical history of mixed dyslipidemia, breast cancer and smoking.  She mentions to me that she will occasionally have chest discomfort.  Overall she leads a sedentary lifestyle.  No radiation of the symptoms to the neck or to the arms.  At the time of my evaluation, the patient is alert awake oriented and in no distress.  Unfortunately she is a smoker.  Her EKG was abnormal and she was referred here for the same reason.  Past Medical History:  Diagnosis Date  . 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 Breast cancer ([place]) 2009  . Gastroesophageal reflux disease without esophagitis   . Genetic testing 06/23/2014   Negative genetic testing on the Breast/Ovarian Cancer panel.  The Breast/Ovarian gene panel offered by [facility] includes sequencing and rearrangement analysis for the following 21 genes:  ATM, BARD1, BRCA1, BRCA2, BRIP1, CDH1, CHEK2, EPCAM, FANCC, MLH1, MSH2, MSH6, NBN, PALB2, PMS2, PTEN, RAD51C, RAD51D, STK11, TP53, and XRCC2.   The report date is June 22, 2014.   Marland Kitchen Hypercholesterolemia with hyperglyceridemia   . Irritable bowel syndrome with diarrhea   . Metabolic syndrome   . 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 Osteoporosis, postmenopausal   . Recurrent major depression in partial remission ([place])   . S/P cataract extraction and insertion of intraocular lens 05/30/2017   OS  05/30/17 OD 06/13/17  . S/P eye surgery 05/08/2017   2006  . S/P YAG capsulotomy 11/28/2017  . Tobacco abuse   . Vitamin D deficiency     Past Surgical History:  Procedure Laterality Date  . BREAST LUMPECTOMY  1996  . CESAREAN SECTION  1985    Current Medications: Current Meds  Medication Sig  . BROMSITE 0.075 % SOLN INT 1 GTT IN OD D FOR 30 DAYS  . Calcium-Phosphorus-Vitamin D (CALCIUM GUMMIES PO) Take by mouth daily.  .  Chlorpheniramine Maleate (ALLERGY RELIEF PO) Take by mouth daily.  . Cholecalciferol (VITAMIN D3 GUMMIES) 25 MCG (1000 UT) CHEW 2 capsules daily.  . DULoxetine (CYMBALTA) 30 MG capsule 1 capsule by mouth daily at PM  . FLAREX 0.1 % ophthalmic suspension   . mirtazapine (REMERON) 15 MG tablet 1/2 OF A tablet by mouth at bedtime  . omeprazole (PRILOSEC) 20 MG capsule Take 20 mg by mouth daily. Pt wants tablets not capsules  . rosuvastatin (CRESTOR) 20 MG tablet TK 1 T PO D  . zoledronic acid (RECLAST) 5 MG/100ML SOLN injection Inject 5 mg into the vein once.     Allergies:   Patient has no known allergies.   Social History   Socioeconomic History  . Marital status: Married    Spouse name: Not on file  . Number of children: Not on file  . Years of education: Not on file  . Highest education level: Not on file  Occupational History  . Not on file  Social Needs  . Financial resource strain: Not on file  . Food insecurity    Worry: Not on file    Inability: Not on file  . Transportation needs    Medical: Not on file    Non-medical: Not on file  Tobacco Use  . Smoking status: Current Every Day Smoker    Packs/day: 1.00    Years: 30.00    Pack years: 30.00    Types: Cigarettes  . Smokeless tobacco: Never Used  Substance and Sexual Activity  . Alcohol use: No  . Drug use: No  . Sexual activity: Not on file  Lifestyle  . Physical activity    Days per week: Not on file    Minutes per session: Not on file  . Stress: Not on file  Relationships  . Social Herbalist on phone: Not on file    Gets together: Not on file    Attends religious service: Not on file    Active member of club or organization: Not on file    Attends meetings of clubs or organizations: Not on file    Relationship status: Not on file  Other Topics Concern  . Not on file  Social History Narrative  . Not on file     Family History: The patient's family history includes Alzheimer's disease in  her sister; Brain cancer in her paternal uncle; Breast cancer in her maternal aunt; Breast cancer (age of onset: 53) in her cousin; Colon cancer in her maternal uncle; Colon cancer (age of onset: 53) in her paternal grandmother; Dementia in her mother; Melanoma in her paternal uncle; Ovarian cancer in her cousin and paternal aunt; Ovarian cancer (age of onset: 72) in her sister; Stroke in her father and maternal grandmother.  ROS:   Please see the history of present illness.    All other systems reviewed and are negative.  EKGs/Labs/Other Studies Reviewed:    The following studies were reviewed  today: EKG done today at her office reveals sinus rhythm and nonspecific ST-T changes.  A question of junctional rhythm was raised on the EKG done at the primary care physician's office.   Recent Labs: No results found for requested labs within last 8760 hours.  Recent Lipid Panel No results found for: CHOL, TRIG, HDL, CHOLHDL, VLDL, LDLCALC, LDLDIRECT  Physical Exam:    VS:  BP (!) 160/62 (BP Location: Left Arm, Patient Position: Sitting, Cuff Size: Normal)   Pulse 87   Ht 5' 4"  (1.626 m)   Wt 161 lb (73 kg)   SpO2 98%   BMI 27.64 kg/m     Wt Readings from Last 3 Encounters:  11/06/18 161 lb (73 kg)     GEN: Patient is in no acute distress HEENT: Normal NECK: No JVD; No carotid bruits LYMPHATICS: No lymphadenopathy CARDIAC: S1 S2 regular, 2/6 systolic murmur at the apex. RESPIRATORY:  Clear to auscultation without rales, wheezing or rhonchi  ABDOMEN: Soft, non-tender, non-distended MUSCULOSKELETAL:  No edema; No deformity  SKIN: Warm and dry NEUROLOGIC:  Alert and oriented x 3 PSYCHIATRIC:  Normal affect    Signed, [name], MD  11/06/2018 11:39 AM    [place]

## 2018-11-06 NOTE — Patient Instructions (Signed)
Medication Instructions:  Your physician recommends that you continue on your current medications as directed. Please refer to the Current Medication list given to you today.  If you need a refill on your cardiac medications before your next appointment, please call your pharmacy.   Lab work: NONE If you have labs (blood work) drawn today and your tests are completely normal, you will receive your results only by: Marland Kitchen MyChart Message (if you have MyChart) OR . A paper copy in the mail If you have any lab test that is abnormal or we need to change your treatment, we will call you to review the results.  Testing/Procedures: You had an EKG performed today.  Your physician has requested that you have an echocardiogram. Echocardiography is a painless test that uses sound waves to create images of your heart. It provides your doctor with information about the size and shape of your heart and how well your heart's chambers and valves are working. This procedure takes approximately one hour. There are no restrictions for this procedure.  Your physician has requested that you have a lexiscan myoview. For further information please visit [email or web address]. Please follow instruction sheet, as given.  Your physician has recommended that you wear a ZIO monitor. ZIO monitors are medical devices that record the heart's electrical activity. Doctors most often use these monitors to diagnose arrhythmias. Arrhythmias are problems with the speed or rhythm of the heartbeat. The monitor is a small, portable device. You can wear one while you do your normal daily activities. This is usually used to diagnose what is causing palpitations/syncope (passing out).You will wear this device for 3 days.   Follow-Up: At [facility], you and your health needs are our priority.  As part of our continuing mission to provide you with exceptional heart care, we have created designated Provider Care Teams.  These Care Teams  include your primary Cardiologist (physician) and Advanced Practice Providers (APPs -  Physician Assistants and Nurse Practitioners) who all work together to provide you with the care you need, when you need it. You will need a follow up appointment in 3 months.   Any Other Special Instructions Will Be Listed Below  Regadenoson injection What is this medicine? REGADENOSON is used to test the heart for coronary artery disease. It is used in patients who can not exercise for their stress test. This medicine may be used for other purposes; ask your health care provider or pharmacist if you have questions. COMMON BRAND NAME(S): Lexiscan What should I tell my health care provider before I take this medicine? They need to know if you have any of these conditions:  heart problems  lung or breathing disease, like asthma or COPD  an unusual or allergic reaction to regadenoson, other medicines, foods, dyes, or preservatives  pregnant or trying to get pregnant  breast-feeding How should I use this medicine? This medicine is for injection into a vein. It is given by a health care professional in a hospital or clinic setting. Talk to your pediatrician regarding the use of this medicine in children. Special care may be needed. Overdosage: If you think you have taken too much of this medicine contact a poison control center or emergency room at once. NOTE: This medicine is only for you. Do not share this medicine with others. What if I miss a dose? This does not apply. What may interact with this medicine?  caffeine  dipyridamole  guarana  theophylline This list may not describe all possible interactions.  Give your health care provider a list of all the medicines, herbs, non-prescription drugs, or dietary supplements you use. Also tell them if you smoke, drink alcohol, or use illegal drugs. Some items may interact with your medicine. What should I watch for while using this medicine? Your  condition will be monitored carefully while you are receiving this medicine. Do not take medicines, foods, or drinks with caffeine (like coffee, tea, or colas) for at least 12 hours before your test. If you do not know if something contains caffeine, ask your health care professional. What side effects may I notice from receiving this medicine? Side effects that you should report to your doctor or health care professional as soon as possible:  allergic reactions like skin rash, itching or hives, swelling of the face, lips, or tongue  breathing problems  chest pain, tightness or palpitations  severe headache Side effects that usually do not require medical attention (report to your doctor or health care professional if they continue or are bothersome):  flushing  headache  irritation or pain at site where injected  nausea, vomiting This list may not describe all possible side effects. Call your doctor for medical advice about side effects. You may report side effects to FDA at 1-800-FDA-1088. Where should I keep my medicine? This drug is given in a hospital or clinic and will not be stored at home. NOTE: This sheet is a summary. It may not cover all possible information. If you have questions about this medicine, talk to your doctor, pharmacist, or health care provider.  2020 Elsevier/Gold Standard (2007-11-05 15:08:13)  Cardiac Nuclear Scan A cardiac nuclear scan is a test that is done to check the flow of blood to your heart. It is done when you are resting and when you are exercising. The test looks for problems such as:  Not enough blood reaching a portion of the heart.  The heart muscle not working as it should. You may need this test if:  You have heart disease.  You have had lab results that are not normal.  You have had heart surgery or a balloon procedure to open up blocked arteries (angioplasty).  You have chest pain.  You have shortness of breath. In this test,  a special dye (tracer) is put into your bloodstream. The tracer will travel to your heart. A camera will then take pictures of your heart to see how the tracer moves through your heart. This test is usually done at a hospital and takes 2-4 hours. Tell a doctor about:  Any allergies you have.  All medicines you are taking, including vitamins, herbs, eye drops, creams, and over-the-counter medicines.  Any problems you or family members have had with anesthetic medicines.  Any blood disorders you have.  Any surgeries you have had.  Any medical conditions you have.  Whether you are pregnant or may be pregnant. What are the risks? Generally, this is a safe test. However, problems may occur, such as:  Serious chest pain and heart attack. This is only a risk if the stress portion of the test is done.  Rapid heartbeat.  A feeling of warmth in your chest. This feeling usually does not last long.  Allergic reaction to the tracer. What happens before the test?  Ask your doctor about changing or stopping your normal medicines. This is important.  Follow instructions from your doctor about what you cannot eat or drink.  Remove your jewelry on the day of the test. What happens  during the test?  An IV tube will be inserted into one of your veins.  Your doctor will give you a small amount of tracer through the IV tube.  You will wait for 20-40 minutes while the tracer moves through your bloodstream.  Your heart will be monitored with an electrocardiogram (ECG).  You will lie down on an exam table.  Pictures of your heart will be taken for about 15-20 minutes.  You may also have a stress test. For this test, one of these things may be done: ? You will be asked to exercise on a treadmill or a stationary bike. ? You will be given medicines that will make your heart work harder. This is done if you are unable to exercise.  When blood flow to your heart has peaked, a tracer will again  be given through the IV tube.  After 20-40 minutes, you will get back on the exam table. More pictures will be taken of your heart.  Depending on the tracer that is used, more pictures may need to be taken 3-4 hours later.  Your IV tube will be removed when the test is over. The test may vary among doctors and hospitals. What happens after the test?  Ask your doctor: ? Whether you can return to your normal schedule, including diet, activities, and medicines. ? Whether you should drink more fluids. This will help to remove the tracer from your body. Drink enough fluid to keep your pee (urine) pale yellow.  Ask your doctor, or the department that is doing the test: ? When will my results be ready? ? How will I get my results? Summary  A cardiac nuclear scan is a test that is done to check the flow of blood to your heart.  Tell your doctor whether you are pregnant or may be pregnant.  Before the test, ask your doctor about changing or stopping your normal medicines. This is important.  Ask your doctor whether you can return to your normal activities. You may be asked to drink more fluids. This information is not intended to replace advice given to you by your health care provider. Make sure you discuss any questions you have with your health care provider. Document Released: 08/21/2017 Document Revised: 06/27/2018 Document Reviewed: 08/21/2017 Elsevier Patient Education  [place].  Echocardiogram An echocardiogram is a procedure that uses painless sound waves (ultrasound) to produce an image of the heart. Images from an echocardiogram can provide important information about:  Signs of coronary artery disease (CAD).  Aneurysm detection. An aneurysm is a weak or damaged part of an artery wall that bulges out from the normal force of blood pumping through the body.  Heart size and shape. Changes in the size or shape of the heart can be associated with certain conditions,  including heart failure, aneurysm, and CAD.  Heart muscle function.  Heart valve function.  Signs of a past heart attack.  Fluid buildup around the heart.  Thickening of the heart muscle.  A tumor or infectious growth around the heart valves. Tell a health care provider about:  Any allergies you have.  All medicines you are taking, including vitamins, herbs, eye drops, creams, and over-the-counter medicines.  Any blood disorders you have.  Any surgeries you have had.  Any medical conditions you have.  Whether you are pregnant or may be pregnant. What are the risks? Generally, this is a safe procedure. However, problems may occur, including:  Allergic reaction to dye (contrast)  that may be used during the procedure. What happens before the procedure? No specific preparation is needed. You may eat and drink normally. What happens during the procedure?   An IV tube may be inserted into one of your veins.  You may receive contrast through this tube. A contrast is an injection that improves the quality of the pictures from your heart.  A gel will be applied to your chest.  A wand-like tool (transducer) will be moved over your chest. The gel will help to transmit the sound waves from the transducer.  The sound waves will harmlessly bounce off of your heart to allow the heart images to be captured in real-time motion. The images will be recorded on a computer. The procedure may vary among health care providers and hospitals. What happens after the procedure?  You may return to your normal, everyday life, including diet, activities, and medicines, unless your health care provider tells you not to do that. Summary  An echocardiogram is a procedure that uses painless sound waves (ultrasound) to produce an image of the heart.  Images from an echocardiogram can provide important information about the size and shape of your heart, heart muscle function, heart valve function,  and fluid buildup around your heart.  You do not need to do anything to prepare before this procedure. You may eat and drink normally.  After the echocardiogram is completed, you may return to your normal, everyday life, unless your health care provider tells you not to do that. This information is not intended to replace advice given to you by your health care provider. Make sure you discuss any questions you have with your health care provider. Document Released: 03/04/2000 Document Revised: 06/28/2018 Document Reviewed: 04/09/2016 Elsevier Patient Education  2020 [employer].

## 2018-11-06 NOTE — Addendum Note (Signed)
Addended by: Beckey Rutter on: 11/06/2018 11:54 AM   Modules accepted: Orders

## 2018-11-09 DIAGNOSIS — R0789 Other chest pain: Secondary | ICD-10-CM

## 2018-11-14 DIAGNOSIS — Z947 Corneal transplant status: Secondary | ICD-10-CM | POA: Diagnosis not present

## 2018-11-14 DIAGNOSIS — R079 Chest pain, unspecified: Secondary | ICD-10-CM | POA: Diagnosis not present

## 2018-11-14 DIAGNOSIS — H17813 Minor opacity of cornea, bilateral: Secondary | ICD-10-CM | POA: Diagnosis not present

## 2018-11-14 DIAGNOSIS — H59031 Cystoid macular edema following cataract surgery, right eye: Secondary | ICD-10-CM | POA: Diagnosis not present

## 2018-11-14 DIAGNOSIS — Z9889 Other specified postprocedural states: Secondary | ICD-10-CM | POA: Diagnosis not present

## 2018-11-14 DIAGNOSIS — I492 Junctional premature depolarization: Secondary | ICD-10-CM | POA: Diagnosis not present

## 2018-11-21 ENCOUNTER — Telehealth: Payer: Self-pay

## 2018-11-21 NOTE — Telephone Encounter (Signed)
-----   Message from Rajan R Revankar, MD sent at 11/17/2018  2:06 PM EDT ----- The results of the study is unremarkable. Please inform patient. I will discuss in detail at next appointment. Cc  primary care/referring physician Rajan R Revankar, MD 11/17/2018 2:06 PM 

## 2018-11-21 NOTE — Telephone Encounter (Signed)
Results relayed copy sent to Dr. Jeryl Columbia per Dr.RRR request.

## 2018-11-22 NOTE — Telephone Encounter (Signed)
Please call with monitor results °

## 2018-11-23 NOTE — Telephone Encounter (Signed)
Patient informed of results.  No further questions.

## 2018-11-29 ENCOUNTER — Telehealth (HOSPITAL_COMMUNITY): Payer: Self-pay | Admitting: *Deleted

## 2018-11-29 NOTE — Telephone Encounter (Signed)
Left message on voicemail in reference to upcoming appointment scheduled for 12/05/18. Phone number given for a call back so details instructions can be given.  Rachel Mcgee

## 2018-11-30 ENCOUNTER — Telehealth (HOSPITAL_COMMUNITY): Payer: Self-pay | Admitting: *Deleted

## 2018-11-30 NOTE — Telephone Encounter (Signed)
Patient given detailed instructions per Myocardial Perfusion Study Information Sheet for the test on 12/05/18 at 11:30. Patient notified to arrive 15 minutes early and that it is imperative to arrive on time for appointment to keep from having the test rescheduled.  If you need to cancel or reschedule your appointment, please call the office within 24 hours of your appointment. . Patient verbalized understanding.Rachel Mcgee

## 2018-12-03 DIAGNOSIS — Z136 Encounter for screening for cardiovascular disorders: Secondary | ICD-10-CM | POA: Diagnosis not present

## 2018-12-03 DIAGNOSIS — Z87891 Personal history of nicotine dependence: Secondary | ICD-10-CM | POA: Diagnosis not present

## 2018-12-03 DIAGNOSIS — F17210 Nicotine dependence, cigarettes, uncomplicated: Secondary | ICD-10-CM | POA: Diagnosis not present

## 2018-12-05 ENCOUNTER — Other Ambulatory Visit: Payer: Self-pay

## 2018-12-05 ENCOUNTER — Ambulatory Visit (INDEPENDENT_AMBULATORY_CARE_PROVIDER_SITE_OTHER): Payer: PPO

## 2018-12-05 DIAGNOSIS — R079 Chest pain, unspecified: Secondary | ICD-10-CM | POA: Diagnosis not present

## 2018-12-05 LAB — MYOCARDIAL PERFUSION IMAGING
LV dias vol: 59 mL (ref 46–106)
LV sys vol: 14 mL
Peak HR: 91 {beats}/min
Rest HR: 75 {beats}/min
SDS: 1
SRS: 2
SSS: 3
TID: 1.1

## 2018-12-05 MED ORDER — TECHNETIUM TC 99M TETROFOSMIN IV KIT
10.9000 | PACK | Freq: Once | INTRAVENOUS | Status: AC | PRN
  Administered 2018-12-05: 10.9 via INTRAVENOUS

## 2018-12-05 MED ORDER — REGADENOSON 0.4 MG/5ML IV SOLN
0.4000 mg | Freq: Once | INTRAVENOUS | Status: AC
  Administered 2018-12-05: 0.4 mg via INTRAVENOUS

## 2018-12-05 MED ORDER — TECHNETIUM TC 99M TETROFOSMIN IV KIT
30.4000 | PACK | Freq: Once | INTRAVENOUS | Status: AC | PRN
  Administered 2018-12-05: 30.4 via INTRAVENOUS

## 2018-12-07 ENCOUNTER — Other Ambulatory Visit: Payer: Self-pay

## 2018-12-07 ENCOUNTER — Ambulatory Visit (INDEPENDENT_AMBULATORY_CARE_PROVIDER_SITE_OTHER): Payer: PPO

## 2018-12-07 DIAGNOSIS — R0789 Other chest pain: Secondary | ICD-10-CM

## 2018-12-07 DIAGNOSIS — R011 Cardiac murmur, unspecified: Secondary | ICD-10-CM | POA: Diagnosis not present

## 2018-12-07 NOTE — Progress Notes (Signed)
Complete echocardiogram has been performed.  Jimmy Nakkia Mackiewicz RDCS, RVT 

## 2018-12-10 ENCOUNTER — Telehealth: Payer: Self-pay | Admitting: *Deleted

## 2018-12-10 ENCOUNTER — Encounter: Payer: Self-pay | Admitting: *Deleted

## 2018-12-10 NOTE — Telephone Encounter (Signed)
Left message on patient's cell phone to return call to discuss lexiscan myoview and echocardiogram results. No answer on patient's home phone, unable to leave a message.

## 2018-12-10 NOTE — Telephone Encounter (Signed)
-----   Message from [name], MD sent at 12/06/2018 11:51 AM EDT ----- Please schedule for appointment to discuss this report.  If I do not have any openings in the next week she may see Dr. Harriet Masson 1 time. [name], MD 12/06/2018 11:51 AM

## 2018-12-10 NOTE — Telephone Encounter (Signed)
Patient returned call. Informed her of echocardiogram results and advised that Dr. Geraldo Pitter would like to see her in the office to discuss her lexiscan myoview results. Patient is agreeable and has been scheduled for an office visit on Thursday, 12/20/2018, at 2:55 pm in the [place] office. Patient was offered an earlier appointment in the [facility] office but declined. No further questions.

## 2018-12-20 ENCOUNTER — Encounter: Payer: Self-pay | Admitting: Cardiology

## 2018-12-20 ENCOUNTER — Other Ambulatory Visit: Payer: Self-pay

## 2018-12-20 ENCOUNTER — Ambulatory Visit (INDEPENDENT_AMBULATORY_CARE_PROVIDER_SITE_OTHER): Payer: PPO | Admitting: Cardiology

## 2018-12-20 VITALS — BP 160/64 | HR 86 | Ht 64.0 in | Wt 164.0 lb

## 2018-12-20 DIAGNOSIS — R931 Abnormal findings on diagnostic imaging of heart and coronary circulation: Secondary | ICD-10-CM

## 2018-12-20 DIAGNOSIS — R0789 Other chest pain: Secondary | ICD-10-CM

## 2018-12-20 DIAGNOSIS — E782 Mixed hyperlipidemia: Secondary | ICD-10-CM | POA: Diagnosis not present

## 2018-12-20 DIAGNOSIS — Z720 Tobacco use: Secondary | ICD-10-CM | POA: Diagnosis not present

## 2018-12-20 MED ORDER — METOPROLOL TARTRATE 50 MG PO TABS: 100.0000 mg | ORAL_TABLET | Freq: Once | ORAL | 0 refills | Status: DC

## 2018-12-20 NOTE — Progress Notes (Signed)
Cardiology Office Note:    Date:  12/20/2018   ID:  Rachel Mcgee, DOB 1950-12-15, MRN [id number]  PCP:  Serita Grammes, MD  Cardiologist:  [name], MD   Referring MD: Serita Grammes, MD    ASSESSMENT:    1. Tobacco abuse   2. Mixed dyslipidemia   3. Chest discomfort    PLAN:    In order of problems listed above:  1. Abnormal nuclear stress test: I discussed my findings with the patient at extensive length.  She has multiple risk factors for coronary artery disease and I discussed invasive and noninvasive evaluations.  She opts to undergo CT coronary angiography and I will respect her wishes.  Benefits and potential risks explained and she vocalized understanding.  She knows to go to the nearest emergency room for any concerning symptoms. 2. Mixed dyslipidemia: Her lipids are markedly elevated.  Diet was explained and she will be back in the next few days for liver lipid check as she is on statin therapy. 3. Cigarette smoker: I spent 5 minutes with the patient discussing solely about smoking. Smoking cessation was counseled. I suggested to the patient also different medications and pharmacological interventions. Patient is keen to try stopping on its own at this time. He will get back to me if he needs any further assistance in this matter. 4. She will be seen in follow-up appointment in a month or earlier if she has any concerns.   Medication Adjustments/Labs and Tests Ordered: Current medicines are reviewed at length with the patient today.  Concerns regarding medicines are outlined above.  No orders of the defined types were placed in this encounter.  No orders of the defined types were placed in this encounter.    Chief Complaint  Patient presents with  . Follow-up    Testing     History of Present Illness:    Rachel Mcgee is a 68 y.o. female.  Patient has past medical history of market dyslipidemia.  Unfortunately she continues to smoke.   Her stress test revealed mild reversibility in the anteroseptal segment.  Patient tells me that she ambulates fine without much symptoms.  She does not exercise on a regular basis.  But she does go for walks.  At the time of my evaluation, the patient is alert awake oriented and in no distress. Past Medical History:  Diagnosis Date  . 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 Breast cancer ([place]) 2009  . Gastroesophageal reflux disease without esophagitis   . Genetic testing 06/23/2014   Negative genetic testing on the Breast/Ovarian Cancer panel.  The Breast/Ovarian gene panel offered by [facility] includes sequencing and rearrangement analysis for the following 21 genes:  ATM, BARD1, BRCA1, BRCA2, BRIP1, CDH1, CHEK2, EPCAM, FANCC, MLH1, MSH2, MSH6, NBN, PALB2, PMS2, PTEN, RAD51C, RAD51D, STK11, TP53, and XRCC2.   The report date is June 22, 2014.   Marland Kitchen Hypercholesterolemia with hyperglyceridemia   . Irritable bowel syndrome with diarrhea   . Metabolic syndrome   . 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 Osteoporosis, postmenopausal   . 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 S/P cataract extraction and insertion of intraocular lens 05/30/2017   OS 05/30/17 OD 06/13/17  . S/P eye surgery 05/08/2017   2006  . S/P YAG capsulotomy 11/28/2017  . Tobacco abuse   . Vitamin D deficiency     Past Surgical History:  Procedure Laterality Date  . BREAST LUMPECTOMY  1996  .  CESAREAN SECTION  1985    Current Medications: Current Meds  Medication Sig  . BROMSITE 0.075 % SOLN INT 1 GTT IN OD D FOR 30 DAYS  . Calcium-Phosphorus-Vitamin D (CALCIUM GUMMIES PO) Take by mouth daily.  . Chlorpheniramine Maleate (ALLERGY RELIEF PO) Take by mouth daily.  . Cholecalciferol (VITAMIN D3 GUMMIES) 25 MCG (1000 UT) CHEW 2 capsules daily.  . DULoxetine (CYMBALTA) 30 MG capsule 1 capsule by mouth daily at PM  . FLAREX 0.1 % ophthalmic suspension   . mirtazapine (REMERON) 15 MG  tablet 1/2 OF A tablet by mouth at bedtime  . omeprazole (PRILOSEC) 20 MG capsule Take 20 mg by mouth daily. Pt wants tablets not capsules  . rosuvastatin (CRESTOR) 20 MG tablet TK 1 T PO D  . zoledronic acid (RECLAST) 5 MG/100ML SOLN injection Inject 5 mg into the vein once.     Allergies:   Patient has no known allergies.   Social History   Socioeconomic History  . Marital status: Married    Spouse name: Not on file  . Number of children: Not on file  . Years of education: Not on file  . Highest education level: Not on file  Occupational History  . Not on file  Social Needs  . Financial resource strain: Not on file  . Food insecurity    Worry: Not on file    Inability: Not on file  . Transportation needs    Medical: Not on file    Non-medical: Not on file  Tobacco Use  . Smoking status: Current Every Day Smoker    Packs/day: 1.00    Years: 30.00    Pack years: 30.00    Types: Cigarettes  . Smokeless tobacco: Never Used  Substance and Sexual Activity  . Alcohol use: No  . Drug use: No  . Sexual activity: Not on file  Lifestyle  . Physical activity    Days per week: Not on file    Minutes per session: Not on file  . Stress: Not on file  Relationships  . Social Herbalist on phone: Not on file    Gets together: Not on file    Attends religious service: Not on file    Active member of club or organization: Not on file    Attends meetings of clubs or organizations: Not on file    Relationship status: Not on file  Other Topics Concern  . Not on file  Social History Narrative  . Not on file     Family History: The patient's family history includes Alzheimer's disease in her sister; Brain cancer in her paternal uncle; Breast cancer in her maternal aunt; Breast cancer (age of onset: 44) in her cousin; Colon cancer in her maternal uncle; Colon cancer (age of onset: 29) in her paternal grandmother; Dementia in her mother; Melanoma in her paternal uncle;  Ovarian cancer in her cousin and paternal aunt; Ovarian cancer (age of onset: 5) in her sister; Stroke in her father and maternal grandmother.  ROS:   Please see the history of present illness.    All other systems reviewed and are negative.  EKGs/Labs/Other Studies Reviewed:    The following studies were reviewed today: IMPRESSIONS    1. Left ventricular ejection fraction, by visual estimation, is 60 to 65%. The left ventricle has normal function. Normal left ventricular size. Left ventricular septal wall thickness was mildly increased. There is mildly increased left ventricular  hypertrophy.  2. The Aortic  Valve is biscupid. There is mild Aortic Stenosis. Aortic valve area, by VTI measures 1.57 cm. Mean gradient 10 mmHG.  3. The mitral valve is normal in structure. Trace mitral valve regurgitation. No evidence of mitral stenosis.  4. The tricuspid valve is normal in structure. Tricuspid valve regurgitation is trivial.  5. The pulmonic valve was normal in structure. Pulmonic valve regurgitation is not visualized by color flow Doppler.  6. Normal pulmonary artery systolic pressure.  7. The inferior vena cava is normal in size with greater than 50% respiratory variability, suggesting right atrial pressure of 3 mmHg.  Study Highlights   Nuclear stress EF: 76%.  The left ventricular ejection fraction is hyperdynamic (>65%).  There was no ST segment deviation noted during stress.  Defect 1: There is a small defect of mild severity present in the basal anteroseptal location.  Findings consistent with ischemia.  This is an intermediate risk study.      EVENT MONITOR REPORT:   Patient was monitored from 11/06/2018 to 11/09/2018. Indication:                    Palpitations Ordering physician:  [name], MD  Referring physician:        [name], MD    Baseline rhythm: Sinus  Minimum heart rate: 64 BPM.  Average heart rate: 80 BPM.  Maximal heart rate 110  BPM.  Atrial arrhythmia: Brief atrial runs fastest and longest was 129 bpm  Ventricular arrhythmia: None significant rare PVC  Conduction abnormality: None significant  Symptoms: None significant   Conclusion:  Mildly abnormal but overall unremarkable event monitor.  Interpreting  cardiologist: [name], MD  Date: 11/17/2018 1:59 PM   Recent Labs: No results found for requested labs within last 8760 hours.  Recent Lipid Panel No results found for: CHOL, TRIG, HDL, CHOLHDL, VLDL, LDLCALC, LDLDIRECT  Physical Exam:    VS:  BP (!) 160/64 (BP Location: Left Arm, Patient Position: Sitting, Cuff Size: Normal)   Pulse 86   Ht 5' 4"  (1.626 m)   Wt 164 lb (74.4 kg)   SpO2 97%   BMI 28.15 kg/m     Wt Readings from Last 3 Encounters:  12/20/18 164 lb (74.4 kg)  12/05/18 161 lb (73 kg)  11/06/18 161 lb (73 kg)     GEN: Patient is in no acute distress HEENT: Normal NECK: No JVD; No carotid bruits LYMPHATICS: No lymphadenopathy CARDIAC: Hear sounds regular, 2/6 systolic murmur at the apex. RESPIRATORY:  Clear to auscultation without rales, wheezing or rhonchi  ABDOMEN: Soft, non-tender, non-distended MUSCULOSKELETAL:  No edema; No deformity  SKIN: Warm and dry NEUROLOGIC:  Alert and oriented x 3 PSYCHIATRIC:  Normal affect   Signed, [name], MD  12/20/2018 3:43 PM    [facility]

## 2018-12-20 NOTE — Patient Instructions (Addendum)
Medication Instructions:  Your physician recommends that you continue on your current medications as directed. Please refer to the Current Medication list given to you today.  If you need a refill on your cardiac medications before your next appointment, please call your pharmacy.   Lab work: You will need to come in FASTING 7 days prior to procedure to have a BMP, hepatic and lipid drawn.  If you have labs (blood work) drawn today and your tests are completely normal, you will receive your results only by: Marland Kitchen MyChart Message (if you have MyChart) OR . A paper copy in the mail If you have any lab test that is abnormal or we need to change your treatment, we will call you to review the results.  Testing/Procedures: Non-Cardiac CT scanning, (CAT scanning), is a noninvasive, special x-ray that produces cross-sectional images of the body using x-rays and a computer. CT scans help physicians diagnose and treat medical conditions. For some CT exams, a contrast material is used to enhance visibility in the area of the body being studied. CT scans provide greater clarity and reveal more details than regular x-ray exams.  Please arrive at the [facility] main entrance of [facility] at xx:xx AM (30-45 minutes prior to test start time)  [facility] [place], [place] [address] [phone]  Proceed to the [facility] Radiology Department (First Floor).  Please follow these instructions carefully (unless otherwise directed):   On the Night Before the Test: . Be sure to Drink plenty of water. . Do not consume any caffeinated/decaffeinated beverages or chocolate 12 hours prior to your test. . Do not take any antihistamines 12 hours prior to your test.  On the Day of the Test: . Drink plenty of water. Do not drink any water within one hour of the test. . Do not eat any food 4 hours prior to the test. . You may take your regular medications prior to the test.  .  Take metoprolol (Lopressor) two hours prior to test.       After the Test: . Drink plenty of water. . After receiving IV contrast, you may experience a mild flushed feeling. This is normal. . On occasion, you may experience a mild rash up to 24 hours after the test. This is not dangerous. If this occurs, you can take Benadryl 25 mg and increase your fluid intake. . If you experience trouble breathing, this can be serious. If it is severe call 911 IMMEDIATELY. If it is mild, please call our office.   Follow-Up: At [facility], you and your health needs are our priority.  As part of our continuing mission to provide you with exceptional heart care, we have created designated Provider Care Teams.  These Care Teams include your primary Cardiologist (physician) and Advanced Practice Providers (APPs -  Physician Assistants and Nurse Practitioners) who all work together to provide you with the care you need, when you need it. You will need a follow up appointment in 1 months.    Any Other Special Instructions Will Be Listed Below  Metoprolol tablets What is this medicine? METOPROLOL (me TOE proe lole) is a beta-blocker. Beta-blockers reduce the workload on the heart and help it to beat more regularly. This medicine is used to treat high blood pressure and to prevent chest pain. It is also used to after a heart attack and to prevent an additional heart attack from occurring. This medicine may be used for other purposes;  ask your health care provider or pharmacist if you have questions. COMMON BRAND NAME(S): Lopressor What should I tell my health care provider before I take this medicine? They need to know if you have any of these conditions: -diabetes -heart or vessel disease like slow heart rate, worsening heart failure, heart block, sick sinus syndrome or Raynaud's disease -kidney disease -liver disease -lung or breathing disease, like asthma or emphysema -pheochromocytoma -thyroid disease  -an unusual or allergic reaction to metoprolol, other beta-blockers, medicines, foods, dyes, or preservatives -pregnant or trying to get pregnant -breast-feeding How should I use this medicine? Take this medicine by mouth with a drink of water. Follow the directions on the prescription label. Take this medicine immediately after meals. Take your doses at regular intervals. Do not take more medicine than directed. Do not stop taking this medicine suddenly. This could lead to serious heart-related effects. Talk to your pediatrician regarding the use of this medicine in children. Special care may be needed. Overdosage: If you think you have taken too much of this medicine contact a poison control center or emergency room at once. NOTE: This medicine is only for you. Do not share this medicine with others. What if I miss a dose? If you miss a dose, take it as soon as you can. If it is almost time for your next dose, take only that dose. Do not take double or extra doses. What may interact with this medicine? This medicine may interact with the following medications: -certain medicines for blood pressure, heart disease, irregular heart beat -certain medicines for depression like monoamine oxidase (MAO) inhibitors, fluoxetine, or paroxetine -clonidine -dobutamine -epinephrine -isoproterenol -reserpine This list may not describe all possible interactions. Give your health care provider a list of all the medicines, herbs, non-prescription drugs, or dietary supplements you use. Also tell them if you smoke, drink alcohol, or use illegal drugs. Some items may interact with your medicine. What should I watch for while using this medicine? Visit your doctor or health care professional for regular check ups. Contact your doctor right away if your symptoms worsen. Check your blood pressure and pulse rate regularly. Ask your health care professional what your blood pressure and pulse rate should be, and when  you should contact them. You may get drowsy or dizzy. Do not drive, use machinery, or do anything that needs mental alertness until you know how this medicine affects you. Do not sit or stand up quickly, especially if you are an older patient. This reduces the risk of dizzy or fainting spells. Contact your doctor if these symptoms continue. Alcohol may interfere with the effect of this medicine. Avoid alcoholic drinks. What side effects may I notice from receiving this medicine? Side effects that you should report to your doctor or health care professional as soon as possible: -allergic reactions like skin rash, itching or hives -cold or numb hands or feet -depression -difficulty breathing -faint -fever with sore throat -irregular heartbeat, chest pain -rapid weight gain -swollen legs or ankles Side effects that usually do not require medical attention (report to your doctor or health care professional if they continue or are bothersome): -anxiety or nervousness -change in sex drive or performance -dry skin -headache -nightmares or trouble sleeping -short term memory loss -stomach upset or diarrhea -unusually tired This list may not describe all possible side effects. Call your doctor for medical advice about side effects. You may report side effects to FDA at 1-800-FDA-1088. Where should I keep my medicine? Keep out  of the reach of children. Store at room temperature between 15 and 30 degrees C (59 and 86 degrees F). Throw away any unused medicine after the expiration date. NOTE: This sheet is a summary. It may not cover all possible information. If you have questions about this medicine, talk to your doctor, pharmacist, or health care provider.  2019 Elsevier/Gold Standard (2012-11-09 14:40:36)   Coronary Angiogram A coronary angiogram is an X-ray procedure that is used to examine the arteries in the heart. In this procedure, a dye (contrast dye) is injected through a long, thin  tube (catheter). The catheter is inserted through the groin, wrist, or arm. The dye is injected into each artery, then X-rays are taken to show if there is a blockage in the arteries of the heart. This procedure can also show if you have valve disease or a disease of the aorta, and it can be used to check the overall function of your heart muscle. You may have a coronary angiogram if:  You are having chest pain, or other symptoms of angina, and you are at risk for heart disease.  You have an abnormal electrocardiogram (ECG) or stress test.  You have chest pain and heart failure.  You are having irregular heart rhythms.  You and your health care provider determine that the benefits of the test information outweigh the risks of the procedure. Let your health care provider know about:  Any allergies you have, including allergies to contrast dye.  All medicines you are taking, including vitamins, herbs, eye drops, creams, and over-the-counter medicines.  Any problems you or family members have had with anesthetic medicines.  Any blood disorders you have.  Any surgeries you have had.  History of kidney problems or kidney failure.  Any medical conditions you have.  Whether you are pregnant or may be pregnant. What are the risks? Generally, this is a safe procedure. However, problems may occur, including:  Infection.  Allergic reaction to medicines or dyes that are used.  Bleeding from the access site or other locations.  Kidney injury, especially in people with impaired kidney function.  Stroke (rare).  Heart attack (rare).  Damage to other structures or organs. What happens before the procedure? Staying hydrated Follow instructions from your health care provider about hydration, which may include:  Up to 2 hours before the procedure - you may continue to drink clear liquids, such as water, clear fruit juice, black coffee, and plain tea. Eating and drinking restrictions  Follow instructions from your health care provider about eating and drinking, which may include:  8 hours before the procedure - stop eating heavy meals or foods such as meat, fried foods, or fatty foods.  6 hours before the procedure - stop eating light meals or foods, such as toast or cereal.  2 hours before the procedure - stop drinking clear liquids. General instructions  Ask your health care provider about: ? Changing or stopping your regular medicines. This is especially important if you are taking diabetes medicines or blood thinners. ? Taking medicines such as ibuprofen. These medicines can thin your blood. Do not take these medicines before your procedure if your health care provider instructs you not to, though aspirin may be recommended prior to coronary angiograms.  Plan to have someone take you home from the hospital or clinic.  You may need to have blood tests or X-rays done. What happens during the procedure?  An IV tube will be inserted into one of your veins.  You will be given one or more of the following: ? A medicine to help you relax (sedative). ? A medicine to numb the area where the catheter will be inserted into an artery (local anesthetic).  To reduce your risk of infection: ? Your health care team will wash or sanitize their hands. ? Your skin will be washed with soap. ? Hair may be removed from the area where the catheter will be inserted.  You will be connected to a continuous ECG monitor.  The catheter will be inserted into an artery. The location may be in your groin, in your wrist, or in the fold of your arm (near your elbow).  A type of X-ray (fluoroscopy) will be used to help guide the catheter to the opening of the blood vessel that is being examined.  A dye will be injected into the catheter, and X-rays will be taken. The dye will help to show where any narrowing or blockages are located in the heart arteries.  Tell your health care provider if  you have any chest pain or trouble breathing during the procedure.  If blockages are found, your health care provider may perform another procedure, such as inserting a coronary stent. The procedure may vary among health care providers and hospitals. What happens after the procedure?  After the procedure, you will need to keep the area still for a few hours, or for as long as told by your health care provider. If the procedure is done through the groin, you will be instructed to not bend and not cross your legs.  The insertion site will be checked frequently.  The pulse in your foot or wrist will be checked frequently.  You may have additional blood tests, X-rays, and a test that records the electrical activity of your heart (ECG).  Do not drive for 24 hours if you were given a sedative. Summary  A coronary angiogram is an X-ray procedure that is used to look into the arteries in the heart.  During the procedure, a dye (contrast dye) is injected through a long, thin tube (catheter). The catheter is inserted through the groin, wrist, or arm.  Tell your health care provider about any allergies you have, including allergies to contrast dye.  After the procedure, you will need to keep the area still for a few hours, or for as long as told by your health care provider. This information is not intended to replace advice given to you by your health care provider. Make sure you discuss any questions you have with your health care provider. Document Released: 09/11/2002 Document Revised: 12/18/2015 Document Reviewed: 12/18/2015 Elsevier Interactive Patient Education  2019 [place] and Cholesterol Restricted Eating Plan Getting too much fat and cholesterol in your diet may cause health problems. Choosing the right foods helps keep your fat and cholesterol at normal levels. This can keep you from getting certain diseases. Your doctor may recommend an eating plan that includes:  Total  fat: ______% or less of total calories a day.  Saturated fat: ______% or less of total calories a day.  Cholesterol: less than _________mg a day.  Fiber: ______g a day. What are tips for following this plan? Meal planning  At meals, divide your plate into four equal parts: ? Fill one-half of your plate with vegetables and green salads. ? Fill one-fourth of your plate with whole grains. ? Fill one-fourth of your plate with low-fat (lean) protein foods.  Eat fish that is high  in omega-3 fats at least two times a week. This includes mackerel, tuna, sardines, and salmon.  Eat foods that are high in fiber, such as whole grains, beans, apples, broccoli, carrots, peas, and barley. General tips   Work with your doctor to lose weight if you need to.  Avoid: ? Foods with added sugar. ? Fried foods. ? Foods with partially hydrogenated oils.  Limit alcohol intake to no more than 1 drink a day for nonpregnant women and 2 drinks a day for men. One drink equals 12 oz of beer, 5 oz of wine, or 1 oz of hard liquor. Reading food labels  Check food labels for: ? Trans fats. ? Partially hydrogenated oils. ? Saturated fat (g) in each serving. ? Cholesterol (mg) in each serving. ? Fiber (g) in each serving.  Choose foods with healthy fats, such as: ? Monounsaturated fats. ? Polyunsaturated fats. ? Omega-3 fats.  Choose grain products that have whole grains. Look for the word "whole" as the first word in the ingredient list. Cooking  Cook foods using low-fat methods. These include baking, boiling, grilling, and broiling.  Eat more home-cooked foods. Eat at restaurants and buffets less often.  Avoid cooking using saturated fats, such as butter, cream, palm oil, palm kernel oil, and coconut oil. Recommended foods  Fruits  All fresh, canned (in natural juice), or frozen fruits. Vegetables  Fresh or frozen vegetables (raw, steamed, roasted, or grilled). Green salads. Grains  Whole  grains, such as whole wheat or whole grain breads, crackers, cereals, and pasta. Unsweetened oatmeal, bulgur, barley, quinoa, or brown rice. Corn or whole wheat flour tortillas. Meats and other protein foods  Ground beef (85% or leaner), grass-fed beef, or beef trimmed of fat. Skinless chicken or [place]. Ground chicken or [place]. Pork trimmed of fat. All fish and seafood. Egg whites. Dried beans, peas, or lentils. Unsalted nuts or seeds. Unsalted canned beans. Nut butters without added sugar or oil. Dairy  Low-fat or nonfat dairy products, such as skim or 1% milk, 2% or reduced-fat cheeses, low-fat and fat-free ricotta or cottage cheese, or plain low-fat and nonfat yogurt. Fats and oils  Tub margarine without trans fats. Light or reduced-fat mayonnaise and salad dressings. Avocado. Olive, canola, sesame, or safflower oils. The items listed above may not be a complete list of foods and beverages you can eat. Contact a dietitian for more information. Foods to avoid Fruits  Canned fruit in heavy syrup. Fruit in cream or butter sauce. Fried fruit. Vegetables  Vegetables cooked in cheese, cream, or butter sauce. Fried vegetables. Grains  White bread. White pasta. White rice. Cornbread. Bagels, pastries, and croissants. Crackers and snack foods that contain trans fat and hydrogenated oils. Meats and other protein foods  Fatty cuts of meat. Ribs, chicken wings, bacon, sausage, bologna, salami, chitterlings, fatback, hot dogs, bratwurst, and packaged lunch meats. Liver and organ meats. Whole eggs and egg yolks. Chicken and [place] with skin. Fried meat. Dairy  Whole or 2% milk, cream, half-and-half, and cream cheese. Whole milk cheeses. Whole-fat or sweetened yogurt. Full-fat cheeses. Nondairy creamers and whipped toppings. Processed cheese, cheese spreads, and cheese curds. Beverages  Alcohol. Sugar-sweetened drinks such as sodas, lemonade, and fruit drinks. Fats and oils  Butter, stick  margarine, lard, shortening, ghee, or bacon fat. Coconut, palm kernel, and palm oils. Sweets and desserts  Corn syrup, sugars, honey, and molasses. Candy. Jam and jelly. Syrup. Sweetened cereals. Cookies, pies, cakes, donuts, muffins, and ice cream. The items listed above may  not be a complete list of foods and beverages you should avoid. Contact a dietitian for more information. Summary  Choosing the right foods helps keep your fat and cholesterol at normal levels. This can keep you from getting certain diseases.  At meals, fill one-half of your plate with vegetables and green salads.  Eat high-fiber foods, like whole grains, beans, apples, carrots, peas, and barley.  Limit added sugar, saturated fats, alcohol, and fried foods. This information is not intended to replace advice given to you by your health care provider. Make sure you discuss any questions you have with your health care provider. Document Released: 09/06/2011 Document Revised: 11/08/2017 Document Reviewed: 11/22/2016 Elsevier Patient Education  2020 [employer].

## 2018-12-21 ENCOUNTER — Other Ambulatory Visit: Payer: Self-pay | Admitting: Cardiology

## 2019-01-23 DIAGNOSIS — Z9889 Other specified postprocedural states: Secondary | ICD-10-CM | POA: Diagnosis not present

## 2019-01-23 DIAGNOSIS — H17813 Minor opacity of cornea, bilateral: Secondary | ICD-10-CM | POA: Diagnosis not present

## 2019-01-23 DIAGNOSIS — H3530 Unspecified macular degeneration: Secondary | ICD-10-CM | POA: Diagnosis not present

## 2019-01-23 DIAGNOSIS — Z9849 Cataract extraction status, unspecified eye: Secondary | ICD-10-CM | POA: Diagnosis not present

## 2019-01-23 DIAGNOSIS — H04123 Dry eye syndrome of bilateral lacrimal glands: Secondary | ICD-10-CM | POA: Diagnosis not present

## 2019-01-23 DIAGNOSIS — Z961 Presence of intraocular lens: Secondary | ICD-10-CM | POA: Diagnosis not present

## 2019-02-08 DIAGNOSIS — E782 Mixed hyperlipidemia: Secondary | ICD-10-CM | POA: Diagnosis not present

## 2019-02-08 DIAGNOSIS — R931 Abnormal findings on diagnostic imaging of heart and coronary circulation: Secondary | ICD-10-CM | POA: Diagnosis not present

## 2019-02-08 DIAGNOSIS — R0789 Other chest pain: Secondary | ICD-10-CM | POA: Diagnosis not present

## 2019-02-08 LAB — BASIC METABOLIC PANEL
BUN/Creatinine Ratio: 16 (ref 12–28)
BUN: 13 mg/dL (ref 8–27)
CO2: 25 mmol/L (ref 20–29)
Calcium: 9.6 mg/dL (ref 8.7–10.3)
Chloride: 104 mmol/L (ref 96–106)
Creatinine, Ser: 0.81 mg/dL (ref 0.57–1.00)
GFR calc Af Amer: 86 mL/min/{1.73_m2} (ref >59)
GFR calc non Af Amer: 75 mL/min/{1.73_m2} (ref >59)
Glucose: 106 mg/dL — ABNORMAL HIGH (ref 65–99)
Potassium: 4 mmol/L (ref 3.5–5.2)
Sodium: 142 mmol/L (ref 134–144)

## 2019-02-08 LAB — LIPID PANEL
Chol/HDL Ratio: 4 ratio (ref 0.0–4.4)
Cholesterol, Total: 186 mg/dL (ref 100–199)
HDL: 46 mg/dL (ref >39)
LDL Chol Calc (NIH): 108 mg/dL — ABNORMAL HIGH (ref 0–99)
Triglycerides: 184 mg/dL — ABNORMAL HIGH (ref 0–149)
VLDL Cholesterol Cal: 32 mg/dL (ref 5–40)

## 2019-02-08 LAB — HEPATIC FUNCTION PANEL
ALT: 10 IU/L (ref 0–32)
AST: 12 IU/L (ref 0–40)
Albumin: 4.3 g/dL (ref 3.8–4.8)
Alkaline Phosphatase: 136 IU/L — ABNORMAL HIGH (ref 39–117)
Bilirubin Total: 0.2 mg/dL (ref 0.0–1.2)
Bilirubin, Direct: 0.08 mg/dL (ref 0.00–0.40)
Total Protein: 7 g/dL (ref 6.0–8.5)

## 2019-02-12 ENCOUNTER — Ambulatory Visit: Payer: PPO | Admitting: Cardiology

## 2019-02-18 ENCOUNTER — Telehealth: Payer: Self-pay

## 2019-02-18 NOTE — Telephone Encounter (Signed)
-----   Message from [name], MD sent at 02/08/2019  6:00 PM EST ----- The results of the study is unremarkable. Please inform patient. I will discuss in detail at next appointment. Cc  primary care/referring physician [name], MD 02/08/2019 6:00 PM

## 2019-02-18 NOTE — Telephone Encounter (Signed)
Results relayed, no further questions. 

## 2019-02-18 NOTE — Telephone Encounter (Signed)
Left message for patient to call back for results, copy sent to DR. Burgart

## 2019-02-19 ENCOUNTER — Telehealth (HOSPITAL_COMMUNITY): Payer: Self-pay | Admitting: Emergency Medicine

## 2019-02-19 NOTE — Telephone Encounter (Signed)
Left message on voicemail with name and callback number Minal Stuller RN Navigator Cardiac Imaging [facility] [phone] Office [phone] Cell  

## 2019-02-20 ENCOUNTER — Encounter: Payer: PPO | Admitting: *Deleted

## 2019-02-20 ENCOUNTER — Ambulatory Visit (HOSPITAL_COMMUNITY)
Admission: RE | Admit: 2019-02-20 | Discharge: 2019-02-20 | Disposition: A | Discharge status: Home / Self Care | Payer: PPO | Source: Ambulatory Visit | Attending: Cardiology | Admitting: Cardiology

## 2019-02-20 ENCOUNTER — Other Ambulatory Visit: Payer: Self-pay

## 2019-02-20 DIAGNOSIS — R918 Other nonspecific abnormal finding of lung field: Secondary | ICD-10-CM | POA: Insufficient documentation

## 2019-02-20 DIAGNOSIS — R931 Abnormal findings on diagnostic imaging of heart and coronary circulation: Secondary | ICD-10-CM | POA: Insufficient documentation

## 2019-02-20 DIAGNOSIS — Z006 Encounter for examination for normal comparison and control in clinical research program: Secondary | ICD-10-CM

## 2019-02-20 DIAGNOSIS — R590 Localized enlarged lymph nodes: Secondary | ICD-10-CM | POA: Diagnosis not present

## 2019-02-20 DIAGNOSIS — R0789 Other chest pain: Secondary | ICD-10-CM | POA: Diagnosis not present

## 2019-02-20 DIAGNOSIS — I2510 Atherosclerotic heart disease of native coronary artery without angina pectoris: Secondary | ICD-10-CM | POA: Diagnosis not present

## 2019-02-20 DIAGNOSIS — I700 Atherosclerosis of aorta: Secondary | ICD-10-CM | POA: Insufficient documentation

## 2019-02-20 MED ORDER — SODIUM CHLORIDE 0.9% FLUSH: 10.0000 mL | Freq: Two times a day (BID) | INTRAVENOUS | Status: DC

## 2019-02-20 MED ORDER — HEPARIN SOD (PORK) LOCK FLUSH 100 UNIT/ML IV SOLN
500.0000 [IU] | INTRAVENOUS | Status: AC | PRN
  Administered 2019-02-20: 500 [IU]
  Filled 2019-02-20: qty 5

## 2019-02-20 MED ORDER — METOPROLOL TARTRATE 5 MG/5ML IV SOLN: 5.0000 mg | INTRAVENOUS | Status: DC | PRN

## 2019-02-20 MED ORDER — SODIUM CHLORIDE 0.9% FLUSH
10.0000 mL | INTRAVENOUS | Status: DC | PRN
  Administered 2019-02-20: 10 mL
  Filled 2019-02-20: qty 40

## 2019-02-20 MED ORDER — NITROGLYCERIN 0.4 MG SL SUBL: 0.8000 mg | SUBLINGUAL_TABLET | SUBLINGUAL | Status: DC | PRN

## 2019-02-20 MED ORDER — CHLORHEXIDINE GLUCONATE CLOTH 2 % EX PADS: 6.0000 | MEDICATED_PAD | Freq: Every day | CUTANEOUS | Status: DC

## 2019-02-20 MED ORDER — IOHEXOL 350 MG/ML SOLN
100.0000 mL | Freq: Once | INTRAVENOUS | Status: AC | PRN
  Administered 2019-02-20: 100 mL via INTRAVENOUS

## 2019-02-20 MED ORDER — METOPROLOL TARTRATE 5 MG/5ML IV SOLN
INTRAVENOUS | Status: AC
  Administered 2019-02-20: 5 mg
  Filled 2019-02-20: qty 20

## 2019-02-20 MED ORDER — NITROGLYCERIN 0.4 MG SL SUBL
0.8000 mg | SUBLINGUAL_TABLET | Freq: Once | SUBLINGUAL | Status: AC
  Administered 2019-02-20: 16:00:00 0.8 mg via SUBLINGUAL

## 2019-02-20 MED ORDER — NITROGLYCERIN 0.4 MG SL SUBL
SUBLINGUAL_TABLET | SUBLINGUAL | Status: AC
  Filled 2019-02-20: qty 2

## 2019-02-20 NOTE — Research (Signed)
CADFEM Informed Consent                  Subject Name:   Rachel Mcgee   Subject met inclusion and exclusion criteria.  The informed consent form, study requirements and expectations were reviewed with the subject and questions and concerns were addressed prior to the signing of the consent form.  The subject verbalized understanding of the trial requirements.  The subject agreed to participate in the CADFEM trial and signed the informed consent.  The informed consent was obtained prior to performance of any protocol-specific procedures for the subject.  A copy of the signed informed consent was given to the subject and a copy was placed in the subject's medical record.   [place] Kieon Lawhorn, Research Assistant  02/20/2019  14:30

## 2019-02-21 DIAGNOSIS — I2510 Atherosclerotic heart disease of native coronary artery without angina pectoris: Secondary | ICD-10-CM | POA: Diagnosis not present

## 2019-02-22 ENCOUNTER — Other Ambulatory Visit: Payer: Self-pay

## 2019-02-22 ENCOUNTER — Telehealth: Payer: Self-pay

## 2019-02-22 ENCOUNTER — Encounter: Payer: Self-pay | Admitting: Cardiology

## 2019-02-22 ENCOUNTER — Ambulatory Visit (INDEPENDENT_AMBULATORY_CARE_PROVIDER_SITE_OTHER): Payer: PPO | Admitting: Cardiology

## 2019-02-22 VITALS — BP 138/58 | HR 86 | Ht 64.0 in | Wt 162.8 lb

## 2019-02-22 DIAGNOSIS — I2510 Atherosclerotic heart disease of native coronary artery without angina pectoris: Secondary | ICD-10-CM

## 2019-02-22 DIAGNOSIS — E782 Mixed hyperlipidemia: Secondary | ICD-10-CM | POA: Diagnosis not present

## 2019-02-22 DIAGNOSIS — Z720 Tobacco use: Secondary | ICD-10-CM | POA: Diagnosis not present

## 2019-02-22 HISTORY — DX: Atherosclerotic heart disease of native coronary artery without angina pectoris: I25.10

## 2019-02-22 MED ORDER — ROSUVASTATIN CALCIUM 40 MG PO TABS: ORAL_TABLET | ORAL | 4 refills | Status: DC

## 2019-02-22 MED ORDER — ASPIRIN EC 81 MG PO TBEC: 81.0000 mg | DELAYED_RELEASE_TABLET | Freq: Every day | ORAL | 3 refills | Status: DC

## 2019-02-22 NOTE — Telephone Encounter (Signed)
Dr. Geraldo Pitter reviewed results in person with patient. Copy sent to Dr. Jeryl Columbia.

## 2019-02-22 NOTE — Patient Instructions (Signed)
Medication Instructions:  Your physician has recommended you make the following change in your medication:    INCREASE rosuvastatin to 40 mg (1 tablet) once daily  START taking aspirin 81 (1 tablet) once daily  *If you need a refill on your cardiac medications before your next appointment, please call your pharmacy*  Lab Work: Your physician recommends that you return FASTING in 6 weeks for lipid and hepatic to be drawn   If you have labs (blood work) drawn today and your tests are completely normal, you will receive your results only by: Marland Kitchen MyChart Message (if you have MyChart) OR . A paper copy in the mail If you have any lab test that is abnormal or we need to change your treatment, we will call you to review the results.  Testing/Procedures: NONE  Follow-Up: At [facility], you and your health needs are our priority.  As part of our continuing mission to provide you with exceptional heart care, we have created designated Provider Care Teams.  These Care Teams include your primary Cardiologist (physician) and Advanced Practice Providers (APPs -  Physician Assistants and Nurse Practitioners) who all work together to provide you with the care you need, when you need it.  Your next appointment:   3 month(s)  The format for your next appointment:   In Person  Provider:   Jyl Heinz, MD  Other Instructions  Aspirin and Your Heart  Aspirin is a medicine that prevents the cells in the blood that are used for clotting, called platelets, from sticking together. Aspirin can be used to help reduce the risk of blood clots, heart attacks, and other heart-related problems. Can I take aspirin? Your health care provider will help you determine whether it is safe and beneficial for you to take aspirin daily. Taking aspirin daily may be helpful if you:  Have had a heart attack or chest pain.  Are at risk for a heart attack.  Have undergone open-heart surgery, such as coronary  artery bypass surgery (CABG).  Have had coronary angioplasty or a stent.  Have had certain types of stroke or transient ischemic attack (TIA).  Have peripheral artery disease (PAD).  Have chronic heart rhythm problems such as atrial fibrillation and cannot take an anticoagulant.  Have valve disease or have had surgery on a valve. What are the risks? Daily use of aspirin can cause side effects. Some of these include:  Bleeding. Bleeding problems can be minor or serious. An example of a minor problem is a cut that does not stop bleeding. An example of a more serious problem is stomach bleeding or, rarely, bleeding into the brain. Your risk of bleeding is increased if you are also taking non-steroidal anti-inflammatory drugs (NSAIDs).  Increased bruising.  Upset stomach.  An allergic reaction. People who have nasal polyps have an increased risk of developing an aspirin allergy. General guidelines  Take aspirin only as told by your health care provider. Make sure that you understand how much you should take and what form you should take. The two forms of aspirin are: ? Non-enteric-coated.This type of aspirin does not have a coating and is absorbed quickly. This type of aspirin also comes in a chewable form. ? Enteric-coated. This type of aspirin has a coating that releases the medicine very slowly. Enteric-coated aspirin might cause less stomach upset than non-enteric-coated aspirin. This type of aspirin should not be chewed or crushed.  Limit alcohol intake to no more than 1 drink a day for nonpregnant  women and 2 drinks a day for men. Drinking alcohol increases your risk of bleeding. One drink equals 12 oz of beer, 5 oz of wine, or 1 oz of hard liquor. Contact a health care provider if you:  Have unusual bleeding or bruising.  Have stomach pain or nausea.  Have ringing in your ears.  Have an allergic reaction that causes: ? Hives. ? Itchy skin. ? Swelling of the lips, tongue, or  face. Get help right away if you:  Notice that your bowel movements are bloody, dark red, or black in color.  Vomit or cough up blood.  Have blood in your urine.  Cough, have noisy breathing (wheeze), or feel short of breath.  Have chest pain, especially if the pain spreads to the arms, back, neck, or jaw.  Have a severe headache, or a headache with confusion, or dizziness. These symptoms may represent a serious problem that is an emergency. Do not wait to see if the symptoms will go away. Get medical help right away. Call your local emergency services (911 in the [place] Do not drive yourself to the hospital. Summary  Aspirin can be used to help reduce the risk of blood clots, heart attacks, and other heart-related problems.  Daily use of aspirin can increase your risk of side effects. Your health care provider will help you determine whether it is safe and beneficial for you to take aspirin daily.  Take aspirin only as told by your health care provider. Make sure that you understand how much you can take and what form you can take. This information is not intended to replace advice given to you by your health care provider. Make sure you discuss any questions you have with your health care provider. Document Released: 02/18/2008 Document Revised: 01/05/2017 Document Reviewed: 01/05/2017 Elsevier Patient Education  2020 [employer].

## 2019-02-22 NOTE — Progress Notes (Signed)
Cardiology Office Note:    Date:  02/22/2019   ID:  [name], DOB Jan 12, 1951, MRN [id number]  PCP:  Serita Grammes, MD  Cardiologist:  [name], MD   Referring MD: Serita Grammes, MD    ASSESSMENT:    1. Tobacco abuse   2. Coronary artery disease involving native coronary artery of native heart without angina pectoris   3. Mixed dyslipidemia    PLAN:    In order of problems listed above:  1. Coronary artery disease: CT coronary angiography report was detailed to the patient at extensive length.  Multiple questions were answered to Rachel Mcgee satisfaction.  Secondary prevention stressed.  Importance of compliance with diet and medication stressed and she vocalized understanding.  She was advised to take a coated 81 mg baby aspirin on a daily basis. 2. Mixed dyslipidemia: Lipids were reviewed I doubled his statin to rosuvastatin 40 mg daily and she will be back in 6 weeks for liver lipid check. 3. Cigarette smoker: I spent 5 minutes with the patient discussing solely about smoking. Smoking cessation was counseled. I suggested to the patient also different medications and pharmacological interventions. Patient is keen to try stopping on its own at this time. He will get back to me if he needs any further assistance in this matter. 4. Patient will be seen in follow-up appointment in 3 months or earlier if the patient has any concerns I also mentioned to the patient that Rachel Mcgee pulmonary abnormalities from CT scan should be followed by primary care physician and she vocalized understanding and promises to follow-up with Rachel Mcgee primary care physician for the same.  Total time for evaluation was 40 minutes.    Medication Adjustments/Labs and Tests Ordered: Current medicines are reviewed at length with the patient today.  Concerns regarding medicines are outlined above.  No orders of the defined types were placed in this encounter.  Meds ordered this encounter  Medications  .  aspirin EC 81 MG tablet    Sig: Take 1 tablet (81 mg total) by mouth daily.    Dispense:  90 tablet    Refill:  3  . rosuvastatin (CRESTOR) 40 MG tablet    Sig: TK 1 T PO D    Dispense:  30 tablet    Refill:  4     No chief complaint on file.    History of Present Illness:    Rachel Mcgee is a 68 y.o. female.  Patient was evaluated for abnormal stress test.  CT coronary angiography revealed multiple nonobstructive stenosis in the patient is here for follow-up for the same.  Unfortunately she continues to smoke heavily.  No chest pain orthopnea or PND.  She is walking some regularly now.  At the time of my evaluation, the patient is alert awake oriented and in no distress.  Past Medical History:  Diagnosis Date  . 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 Breast cancer ([place]) 2009  . Gastroesophageal reflux disease without esophagitis   . Genetic testing 06/23/2014   Negative genetic testing on the Breast/Ovarian Cancer panel.  The Breast/Ovarian gene panel offered by [facility] includes sequencing and rearrangement analysis for the following 21 genes:  ATM, BARD1, BRCA1, BRCA2, BRIP1, CDH1, CHEK2, EPCAM, FANCC, MLH1, MSH2, MSH6, NBN, PALB2, PMS2, PTEN, RAD51C, RAD51D, STK11, TP53, and XRCC2.   The report date is June 22, 2014.   Marland Kitchen Hypercholesterolemia with hyperglyceridemia   . Irritable bowel syndrome with diarrhea   . Metabolic syndrome   .  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 Osteoporosis, postmenopausal   . Recurrent major depression in partial remission ([place])   . S/P cataract extraction and insertion of intraocular lens 05/30/2017   OS 05/30/17 OD 06/13/17  . S/P eye surgery 05/08/2017   2006  . S/P YAG capsulotomy 11/28/2017  . Tobacco abuse   . Vitamin D deficiency     Past Surgical History:  Procedure Laterality Date  . BREAST LUMPECTOMY  1996  . CESAREAN SECTION  1985    Current Medications: Current Meds  Medication Sig  .  Calcium-Phosphorus-Vitamin D (CALCIUM GUMMIES PO) Take by mouth daily.  . Chlorpheniramine Maleate (ALLERGY RELIEF PO) Take by mouth daily.  . DULoxetine (CYMBALTA) 30 MG capsule 1 capsule by mouth daily at PM  . mirtazapine (REMERON) 15 MG tablet 1/2 OF A tablet by mouth at bedtime  . Multiple Vitamins-Minerals (AIRBORNE PO) Take by mouth.  Marland Kitchen omeprazole (PRILOSEC) 20 MG capsule Take 20 mg by mouth daily. Pt wants tablets not capsules  . rosuvastatin (CRESTOR) 40 MG tablet TK 1 T PO D  . [DISCONTINUED] rosuvastatin (CRESTOR) 20 MG tablet TK 1 T PO D     Allergies:   Patient has no known allergies.   Social History   Socioeconomic History  . Marital status: Married    Spouse name: Not on file  . Number of children: Not on file  . Years of education: Not on file  . Highest education level: Not on file  Occupational History  . Not on file  Social Needs  . Financial resource strain: Not on file  . Food insecurity    Worry: Not on file    Inability: Not on file  . Transportation needs    Medical: Not on file    Non-medical: Not on file  Tobacco Use  . Smoking status: Current Every Day Smoker    Packs/day: 1.00    Years: 30.00    Pack years: 30.00    Types: Cigarettes  . Smokeless tobacco: Never Used  Substance and Sexual Activity  . Alcohol use: No  . Drug use: No  . Sexual activity: Not on file  Lifestyle  . Physical activity    Days per week: Not on file    Minutes per session: Not on file  . Stress: Not on file  Relationships  . Social Herbalist on phone: Not on file    Gets together: Not on file    Attends religious service: Not on file    Active member of club or organization: Not on file    Attends meetings of clubs or organizations: Not on file    Relationship status: Not on file  Other Topics Concern  . Not on file  Social History Narrative  . Not on file     Family History: The patient's family history includes Alzheimer's disease in Rachel Mcgee  sister; Brain cancer in Rachel Mcgee paternal uncle; Breast cancer in Rachel Mcgee maternal aunt; Breast cancer (age of onset: 54) in Rachel Mcgee cousin; Colon cancer in Rachel Mcgee maternal uncle; Colon cancer (age of onset: 91) in Rachel Mcgee paternal grandmother; Dementia in Rachel Mcgee mother; Melanoma in Rachel Mcgee paternal uncle; Ovarian cancer in Rachel Mcgee cousin and paternal aunt; Ovarian cancer (age of onset: 47) in Rachel Mcgee sister; Stroke in Rachel Mcgee father and maternal grandmother.  ROS:   Please see the history of present illness.    All other systems reviewed and are negative.  EKGs/Labs/Other Studies Reviewed:    The  following studies were reviewed today: FINDINGS: FFRct analysis was performed on the original cardiac CT angiogram dataset. Diagrammatic representation of the FFRct analysis is provided in a separate PDF document in PACS. This dictation was created using the PDF document and an interactive 3D model of the results. 3D model is not available in the EMR/PACS. Normal FFR range is >0.80.  1. Left Main: No significant stenosis.  2. LAD: 0.94; no significant stenosis. 3. LCX: 0.91; no significant stenosis. 4. RCA: 0.94; no significant stenosis.  IMPRESSION: 1. Mid LAD lesion not significant by CT FFR. Aggressive medical therapy is recommended.  Eleonore Chiquito, MD   Electronically Signed   By: Eleonore Chiquito   On: 02/21/2019 22:09  CT CORONARY MORPH W/CTA COR W/SCORE Lewanda Rife W/CM &/OR WO/CM (Accession [id number]) (Order [id number]) Imaging Date: 12/20/2018 Department: [facility] Ordering/Authorizing: Manreet Kiernan, Reita Cliche, MD  Exam Information  Status Exam Begun  Exam Ended   Final [99] 02/20/2019 4:35 PM 02/20/2019 4:52 PM  PACS Intelerad Image Link  Show images for CT CORONARY MORPH W/CTA COR W/SCORE W/CA W/CM &/OR WO/CM  Addendum  ADDENDUM REPORT: 02/21/2019 08:31  EXAM: OVER-READ INTERPRETATION  CT CHEST  The following report is an over-read performed by radiologist Dr. Samara Snide [facility], [place] on 02/21/2019. This over-read does not include interpretation of cardiac or coronary anatomy or pathology. The coronary CTA interpretation by the cardiologist is attached.  COMPARISON:  12/03/2018 screening chest CT.  FINDINGS: Cardiovascular: Normal heart size. No significant pericardial effusion/thickening. Tip of superior approach central venous catheter terminates in the lower third of the SVC. Atherosclerotic nonaneurysmal visualized thoracic aorta. Normal caliber visualized pulmonary arteries. No central pulmonary emboli.  Mediastinum/Nodes: Unremarkable esophagus. No pathologically enlarged mediastinal nodes. Mildly enlarged 1.2 cm right hilar node (series 11/image 1) is unchanged since 12/03/2018 and presumably reactive. No additional pathologically enlarged hilar nodes.  Lungs/Pleura: No pneumothorax. No pleural effusion. No acute consolidative airspace disease or lung masses. A few scattered small solid left pulmonary nodules, largest 4 mm in the peripheral lingula (series 12/image 12), all stable since 12/03/2018 screening chest CT. No new significant pulmonary nodules.  Upper abdomen: No acute abnormality.  Musculoskeletal:  No aggressive appearing focal osseous lesions.  IMPRESSION: 1. Scattered small solid left pulmonary nodules, largest 4 mm, all stable since 12/03/2018 screening chest CT. These nodules can be reassessed on follow-up screening chest CT in September 2021. 2. Mildly enlarged right hilar node appears unchanged since 12/03/2018 screening chest CT, presumably benign. 3.  Aortic Atherosclerosis (ICD10-I70.0).   Electronically Signed   By: Ilona Sorrel M.D.   On: 02/21/2019 08:31   Addended by Sharyn Blitz, MD on 02/21/2019 8:34 AM    Study Result  CLINICAL DATA:  Chest pain  EXAM: Cardiac/Coronary CTA  TECHNIQUE: The patient was scanned on a [employer]. A 100 kV prospective scan was triggered  in the descending thoracic aorta at 111 HU's. Axial non-contrast 3 mm slices were carried out through the heart. The data set was analyzed on a dedicated work station and scored using the [place]. Gantry rotation speed was 250 msecs and collimation was .6 mm. 5 mg IV metoprolol and 0.8 mg of sl NTG was given. The 3D data set was reconstructed in 5% intervals of the 35-75 % of the R-R cycle. Diastolic phases were analyzed on a dedicated work station using MPR, MIP and VRT modes. The patient received 80 cc of contrast.  FINDINGS: Image quality: Average.  Noise  artifact is: There is significant motion artifact present.  Coronary Arteries:  Normal coronary origin.  Right dominance.  Left main: The left main is a large caliber vessel with a normal take off from the left coronary cusp that bifurcates to form a left anterior descending artery and a left circumflex artery. There is no plaque or stenosis.  Left anterior descending artery: The proximal LAD contains mild (25-49%) non-calcified plaque. The mid to distal LAD contains a long, moderate non-calcified plaque (50-69%). There is 1 patent diagonal branch.  Left circumflex artery: The LCX is non-dominant. The LCX is patent without plaque or stenosis. The second OM branch is a large vessel with mild non-calcified plaque (25-49%).  Right coronary artery: The RCA is dominant with normal take off from the right coronary cusp. The proximal RCA is patent without plaque or stenosis. The mid RCA contains mild non-calcified plaque (25-49%). The distal RCA is patent. The RCA terminates as a PDA and right posterolateral branch without evidence of plaque or stenosis.  Right Atrium: Right atrial size is within normal limits. Venous catheter present in SVC and terminates at level of RA.  Right Ventricle: The right ventricular cavity is within normal limits.  Left Atrium: Left atrial size is normal in size with no left atrial  appendage filling defect. Small PFO present.  Left Ventricle: The ventricular cavity size is within normal limits. There are no stigmata of prior infarction. There is no abnormal filling defect.  Pulmonary arteries: Normal in size without proximal filling defect.  Pulmonary veins: Normal pulmonary venous drainage.  Pericardium: Normal thickness with no significant effusion or calcium present.  Cardiac valves: The aortic valve is bicuspid, Sievers type 0. There is mild calcification of the AoV. The mitral valve is normal structure without significant calcification.  Aorta: There is mild dilation of the aortic root up to 3.8 cm in largest diameter (sinus to sinus) method. Trace aortic root calcification. The ascending aorta measures up to 3.6 cm in the axial plane at the level of the main pulmonary artery.  Extra-cardiac findings: See attached radiology report for non-cardiac structures.  IMPRESSION: 1. Coronary calcium score of 0.  2. Normal coronary origin with right dominance.  3. Bicuspid aortic valve, Sievers type 0.  4. Mild aortic root dilation (up to 3.8 cm sinus to sinus).  5. Small PFO.  6. Moderate non-calcified plaque in the mid to distal LAD (50-69%).  7. Mild non-calcified plaque in the RCA/LCX (25-49%).  RECOMMENDATIONS: 1. Moderate CAD in the mid LAD. Consider symptom-guided anti-ischemic pharmacotherapy as well as risk factor modification per guideline directed care. Additional analysis with CT FFR will be submitted.  Eleonore Chiquito, MD  Electronically Signed: By: Eleonore Chiquito On: 02/20/2019 19:35         Recent Labs: 02/08/2019: ALT 10; BUN 13; Creatinine, Ser 0.81; Potassium 4.0; Sodium 142  Recent Lipid Panel    Component Value Date/Time   CHOL 186 02/08/2019 0948   TRIG 184 (H) 02/08/2019 0948   HDL 46 02/08/2019 0948   CHOLHDL 4.0 02/08/2019 0948   LDLCALC 108 (H) 02/08/2019 0948    Physical Exam:    VS:   BP (!) 138/58 (BP Location: Left Arm, Patient Position: Sitting, Cuff Size: Normal)   Pulse 86   Ht _0  (1.626 m)   Wt 162 lb 12.8 oz (73.8 kg)   SpO2 98%   BMI 27.94 kg/m     Wt Readings from Last 3 Encounters:  02/22/19 162 lb 12.8  oz (73.8 kg)  12/20/18 164 lb (74.4 kg)  12/05/18 161 lb (73 kg)     GEN: Patient is in no acute distress HEENT: Normal NECK: No JVD; No carotid bruits LYMPHATICS: No lymphadenopathy CARDIAC: Hear sounds regular, 2/6 systolic murmur at the apex. RESPIRATORY:  Clear to auscultation without rales, wheezing or rhonchi  ABDOMEN: Soft, non-tender, non-distended MUSCULOSKELETAL:  No edema; No deformity  SKIN: Warm and dry NEUROLOGIC:  Alert and oriented x 3 PSYCHIATRIC:  Normal affect   Signed, [name], MD  02/22/2019 3:40 PM    [facility]

## 2019-02-27 DIAGNOSIS — J3489 Other specified disorders of nose and nasal sinuses: Secondary | ICD-10-CM | POA: Diagnosis not present

## 2019-02-27 DIAGNOSIS — Z20828 Contact with and (suspected) exposure to other viral communicable diseases: Secondary | ICD-10-CM | POA: Diagnosis not present

## 2019-03-12 ENCOUNTER — Telehealth: Payer: Self-pay

## 2019-03-12 DIAGNOSIS — E782 Mixed hyperlipidemia: Secondary | ICD-10-CM

## 2019-03-12 NOTE — Telephone Encounter (Signed)
-----   Message from [name], MD sent at 03/12/2019 10:40 AM EST ----- Please make sure patient is taking statin regularly and need liver lipid check in 2 months.  Also coated aspirin 81 mg daily. [name], MD 03/12/2019 10:40 AM

## 2019-03-12 NOTE — Telephone Encounter (Signed)
Information relayed, patient is currently taking aspirin/statin as prescribed.Copy sent to Dr. Jeryl Columbia.

## 2019-04-03 DIAGNOSIS — E782 Mixed hyperlipidemia: Secondary | ICD-10-CM | POA: Diagnosis not present

## 2019-04-03 LAB — LIPID PANEL
Chol/HDL Ratio: 4 ratio (ref 0.0–4.4)
Cholesterol, Total: 175 mg/dL (ref 100–199)
HDL: 44 mg/dL (ref >39)
LDL Chol Calc (NIH): 93 mg/dL (ref 0–99)
Triglycerides: 223 mg/dL — ABNORMAL HIGH (ref 0–149)
VLDL Cholesterol Cal: 38 mg/dL (ref 5–40)

## 2019-04-03 LAB — HEPATIC FUNCTION PANEL
ALT: 11 IU/L (ref 0–32)
AST: 12 IU/L (ref 0–40)
Albumin: 4.2 g/dL (ref 3.8–4.8)
Alkaline Phosphatase: 128 IU/L — ABNORMAL HIGH (ref 39–117)
Bilirubin Total: 0.2 mg/dL (ref 0.0–1.2)
Bilirubin, Direct: 0.08 mg/dL (ref 0.00–0.40)
Total Protein: 6.6 g/dL (ref 6.0–8.5)

## 2019-04-03 NOTE — Addendum Note (Signed)
Addended by: Beckey Rutter on: 04/03/2019 10:13 AM   Modules accepted: Orders

## 2019-04-04 ENCOUNTER — Telehealth: Payer: Self-pay

## 2019-04-04 DIAGNOSIS — E782 Mixed hyperlipidemia: Secondary | ICD-10-CM

## 2019-04-04 MED ORDER — ROSUVASTATIN CALCIUM 40 MG PO TABS: 20.0000 mg | ORAL_TABLET | Freq: Every day | ORAL | 4 refills | Status: AC

## 2019-04-04 MED ORDER — FENOFIBRATE 200 MG PO CAPS: 200.0000 mg | ORAL_CAPSULE | Freq: Every day | ORAL | 4 refills | Status: DC

## 2019-04-04 NOTE — Addendum Note (Signed)
Addended by: Beckey Rutter on: 04/04/2019 03:24 PM   Modules accepted: Orders

## 2019-04-04 NOTE — Telephone Encounter (Signed)
Results relayed. Patient states she has only been taking 20 mg of rosuvastatin due to side effects of nausea/vomiting that medicine caused. RN advised that she would relay information to DR. RRR. Patient ok with script for fenofibrate and will come back in 6 weeks for repeat labs.

## 2019-04-04 NOTE — Telephone Encounter (Signed)
Patient is able to tolerate lower dose 20 mg rosuvastatin with no problems. She will come back for repeat labs in 6 weeks

## 2019-04-04 NOTE — Telephone Encounter (Signed)
Left message for patient to call office for results. °

## 2019-04-04 NOTE — Telephone Encounter (Signed)
-----   Message from [name], MD sent at 04/03/2019  3:53 PM EST ----- Triglycerides are elevated.  High-dose fenofibrate.  Diet very low in carbohydrates and liver lipid check in 6 weeks. [name], MD 04/03/2019 3:53 PM

## 2019-04-04 NOTE — Telephone Encounter (Signed)
Is she tolerating this dose well? crestor

## 2019-05-23 ENCOUNTER — Ambulatory Visit: Payer: PPO | Admitting: Cardiology

## 2019-08-22 DIAGNOSIS — K219 Gastro-esophageal reflux disease without esophagitis: Secondary | ICD-10-CM | POA: Diagnosis not present

## 2019-08-22 DIAGNOSIS — Z6828 Body mass index (BMI) 28.0-28.9, adult: Secondary | ICD-10-CM | POA: Diagnosis not present

## 2019-08-22 DIAGNOSIS — E669 Obesity, unspecified: Secondary | ICD-10-CM | POA: Diagnosis not present

## 2019-08-22 DIAGNOSIS — F3341 Major depressive disorder, recurrent, in partial remission: Secondary | ICD-10-CM | POA: Diagnosis not present

## 2019-08-22 DIAGNOSIS — E782 Mixed hyperlipidemia: Secondary | ICD-10-CM | POA: Diagnosis not present

## 2019-08-30 DIAGNOSIS — Z1231 Encounter for screening mammogram for malignant neoplasm of breast: Secondary | ICD-10-CM | POA: Diagnosis not present

## 2019-11-06 DIAGNOSIS — F3341 Major depressive disorder, recurrent, in partial remission: Secondary | ICD-10-CM | POA: Diagnosis not present

## 2019-11-06 DIAGNOSIS — K219 Gastro-esophageal reflux disease without esophagitis: Secondary | ICD-10-CM | POA: Diagnosis not present

## 2019-11-06 DIAGNOSIS — Z720 Tobacco use: Secondary | ICD-10-CM | POA: Diagnosis not present

## 2019-11-06 DIAGNOSIS — Z853 Personal history of malignant neoplasm of breast: Secondary | ICD-10-CM | POA: Diagnosis not present

## 2019-11-06 DIAGNOSIS — E8881 Metabolic syndrome: Secondary | ICD-10-CM | POA: Diagnosis not present

## 2019-11-06 DIAGNOSIS — Z13820 Encounter for screening for osteoporosis: Secondary | ICD-10-CM | POA: Diagnosis not present

## 2019-11-06 DIAGNOSIS — E782 Mixed hyperlipidemia: Secondary | ICD-10-CM | POA: Diagnosis not present

## 2019-11-06 DIAGNOSIS — Z0000 Encounter for general adult medical examination without abnormal findings: Secondary | ICD-10-CM | POA: Diagnosis not present

## 2019-11-06 DIAGNOSIS — M810 Age-related osteoporosis without current pathological fracture: Secondary | ICD-10-CM | POA: Diagnosis not present

## 2019-11-06 DIAGNOSIS — Z6828 Body mass index (BMI) 28.0-28.9, adult: Secondary | ICD-10-CM | POA: Diagnosis not present

## 2019-12-04 DIAGNOSIS — M810 Age-related osteoporosis without current pathological fracture: Secondary | ICD-10-CM | POA: Diagnosis not present

## 2019-12-05 DIAGNOSIS — N6002 Solitary cyst of left breast: Secondary | ICD-10-CM | POA: Diagnosis not present

## 2019-12-05 DIAGNOSIS — N6012 Diffuse cystic mastopathy of left breast: Secondary | ICD-10-CM | POA: Diagnosis not present

## 2020-01-29 DIAGNOSIS — Z9849 Cataract extraction status, unspecified eye: Secondary | ICD-10-CM | POA: Diagnosis not present

## 2020-01-29 DIAGNOSIS — H3530 Unspecified macular degeneration: Secondary | ICD-10-CM | POA: Diagnosis not present

## 2020-01-29 DIAGNOSIS — H353131 Nonexudative age-related macular degeneration, bilateral, early dry stage: Secondary | ICD-10-CM | POA: Diagnosis not present

## 2020-01-29 DIAGNOSIS — H17813 Minor opacity of cornea, bilateral: Secondary | ICD-10-CM | POA: Diagnosis not present

## 2020-01-29 DIAGNOSIS — Z79899 Other long term (current) drug therapy: Secondary | ICD-10-CM | POA: Diagnosis not present

## 2020-01-29 DIAGNOSIS — Z9842 Cataract extraction status, left eye: Secondary | ICD-10-CM | POA: Diagnosis not present

## 2020-01-29 DIAGNOSIS — H04123 Dry eye syndrome of bilateral lacrimal glands: Secondary | ICD-10-CM | POA: Diagnosis not present

## 2020-01-29 DIAGNOSIS — Z9889 Other specified postprocedural states: Secondary | ICD-10-CM | POA: Diagnosis not present

## 2020-01-29 DIAGNOSIS — Z961 Presence of intraocular lens: Secondary | ICD-10-CM | POA: Diagnosis not present

## 2020-02-06 DIAGNOSIS — D485 Neoplasm of uncertain behavior of skin: Secondary | ICD-10-CM | POA: Diagnosis not present

## 2020-02-06 DIAGNOSIS — D1801 Hemangioma of skin and subcutaneous tissue: Secondary | ICD-10-CM | POA: Diagnosis not present

## 2020-02-06 DIAGNOSIS — L814 Other melanin hyperpigmentation: Secondary | ICD-10-CM | POA: Diagnosis not present

## 2020-02-06 DIAGNOSIS — B079 Viral wart, unspecified: Secondary | ICD-10-CM | POA: Diagnosis not present

## 2020-02-06 DIAGNOSIS — L905 Scar conditions and fibrosis of skin: Secondary | ICD-10-CM | POA: Diagnosis not present

## 2020-02-06 DIAGNOSIS — Z85828 Personal history of other malignant neoplasm of skin: Secondary | ICD-10-CM | POA: Diagnosis not present

## 2020-02-06 DIAGNOSIS — L821 Other seborrheic keratosis: Secondary | ICD-10-CM | POA: Diagnosis not present

## 2020-02-06 DIAGNOSIS — D229 Melanocytic nevi, unspecified: Secondary | ICD-10-CM | POA: Diagnosis not present

## 2020-04-01 DIAGNOSIS — H3530 Unspecified macular degeneration: Secondary | ICD-10-CM | POA: Diagnosis not present

## 2020-04-01 DIAGNOSIS — Z79899 Other long term (current) drug therapy: Secondary | ICD-10-CM | POA: Diagnosis not present

## 2020-04-01 DIAGNOSIS — Z961 Presence of intraocular lens: Secondary | ICD-10-CM | POA: Diagnosis not present

## 2020-04-01 DIAGNOSIS — H04123 Dry eye syndrome of bilateral lacrimal glands: Secondary | ICD-10-CM | POA: Diagnosis not present

## 2020-04-01 DIAGNOSIS — H353131 Nonexudative age-related macular degeneration, bilateral, early dry stage: Secondary | ICD-10-CM | POA: Diagnosis not present

## 2020-04-01 DIAGNOSIS — Z9841 Cataract extraction status, right eye: Secondary | ICD-10-CM | POA: Diagnosis not present

## 2020-04-01 DIAGNOSIS — Z9849 Cataract extraction status, unspecified eye: Secondary | ICD-10-CM | POA: Diagnosis not present

## 2020-04-01 DIAGNOSIS — Z9842 Cataract extraction status, left eye: Secondary | ICD-10-CM | POA: Diagnosis not present

## 2020-04-01 DIAGNOSIS — Z9889 Other specified postprocedural states: Secondary | ICD-10-CM | POA: Diagnosis not present

## 2020-05-01 DIAGNOSIS — F3341 Major depressive disorder, recurrent, in partial remission: Secondary | ICD-10-CM | POA: Diagnosis not present

## 2020-05-01 DIAGNOSIS — Z6828 Body mass index (BMI) 28.0-28.9, adult: Secondary | ICD-10-CM | POA: Diagnosis not present

## 2020-05-01 DIAGNOSIS — Z9181 History of falling: Secondary | ICD-10-CM | POA: Diagnosis not present

## 2020-06-03 DIAGNOSIS — Z961 Presence of intraocular lens: Secondary | ICD-10-CM | POA: Diagnosis not present

## 2020-06-03 DIAGNOSIS — Z9849 Cataract extraction status, unspecified eye: Secondary | ICD-10-CM | POA: Diagnosis not present

## 2020-06-03 DIAGNOSIS — H26492 Other secondary cataract, left eye: Secondary | ICD-10-CM | POA: Diagnosis not present

## 2020-06-03 DIAGNOSIS — H1820 Unspecified corneal edema: Secondary | ICD-10-CM | POA: Diagnosis not present

## 2020-06-03 DIAGNOSIS — Z947 Corneal transplant status: Secondary | ICD-10-CM | POA: Diagnosis not present

## 2020-06-03 DIAGNOSIS — Z9889 Other specified postprocedural states: Secondary | ICD-10-CM | POA: Diagnosis not present

## 2020-07-16 DIAGNOSIS — J0110 Acute frontal sinusitis, unspecified: Secondary | ICD-10-CM | POA: Diagnosis not present

## 2020-07-16 DIAGNOSIS — Z6828 Body mass index (BMI) 28.0-28.9, adult: Secondary | ICD-10-CM | POA: Diagnosis not present

## 2020-07-16 DIAGNOSIS — R030 Elevated blood-pressure reading, without diagnosis of hypertension: Secondary | ICD-10-CM | POA: Diagnosis not present

## 2020-07-18 DIAGNOSIS — K219 Gastro-esophageal reflux disease without esophagitis: Secondary | ICD-10-CM | POA: Diagnosis not present

## 2020-07-18 DIAGNOSIS — M810 Age-related osteoporosis without current pathological fracture: Secondary | ICD-10-CM | POA: Diagnosis not present

## 2020-07-18 DIAGNOSIS — F3341 Major depressive disorder, recurrent, in partial remission: Secondary | ICD-10-CM | POA: Diagnosis not present

## 2020-08-03 DIAGNOSIS — Z6828 Body mass index (BMI) 28.0-28.9, adult: Secondary | ICD-10-CM | POA: Diagnosis not present

## 2020-08-03 DIAGNOSIS — J309 Allergic rhinitis, unspecified: Secondary | ICD-10-CM | POA: Diagnosis not present

## 2020-08-03 DIAGNOSIS — R030 Elevated blood-pressure reading, without diagnosis of hypertension: Secondary | ICD-10-CM | POA: Diagnosis not present

## 2020-08-03 DIAGNOSIS — F17200 Nicotine dependence, unspecified, uncomplicated: Secondary | ICD-10-CM | POA: Diagnosis not present

## 2020-08-03 DIAGNOSIS — F3341 Major depressive disorder, recurrent, in partial remission: Secondary | ICD-10-CM | POA: Diagnosis not present

## 2020-08-03 DIAGNOSIS — G609 Hereditary and idiopathic neuropathy, unspecified: Secondary | ICD-10-CM | POA: Diagnosis not present

## 2020-09-03 DIAGNOSIS — K580 Irritable bowel syndrome with diarrhea: Secondary | ICD-10-CM | POA: Diagnosis not present

## 2020-09-03 DIAGNOSIS — Z720 Tobacco use: Secondary | ICD-10-CM | POA: Diagnosis not present

## 2020-09-03 DIAGNOSIS — F3341 Major depressive disorder, recurrent, in partial remission: Secondary | ICD-10-CM | POA: Diagnosis not present

## 2020-09-03 DIAGNOSIS — B351 Tinea unguium: Secondary | ICD-10-CM | POA: Diagnosis not present

## 2020-09-03 DIAGNOSIS — Z6825 Body mass index (BMI) 25.0-25.9, adult: Secondary | ICD-10-CM | POA: Diagnosis not present

## 2020-09-18 ENCOUNTER — Telehealth: Payer: Self-pay

## 2020-09-18 ENCOUNTER — Ambulatory Visit: Payer: PPO | Admitting: Gastroenterology

## 2020-09-18 NOTE — Telephone Encounter (Signed)
Pt returned call and was r/s to 8/10 at 11:00am.

## 2020-09-18 NOTE — Telephone Encounter (Signed)
LVM on cell. Home asked about remote access.  Patient needs to reschedule her appointment on 7-1 to another day

## 2020-10-01 ENCOUNTER — Ambulatory Visit (INDEPENDENT_AMBULATORY_CARE_PROVIDER_SITE_OTHER): Payer: PPO

## 2020-10-01 ENCOUNTER — Encounter: Payer: Self-pay | Admitting: Podiatry

## 2020-10-01 ENCOUNTER — Other Ambulatory Visit: Payer: Self-pay

## 2020-10-01 ENCOUNTER — Ambulatory Visit: Payer: PPO | Admitting: Podiatry

## 2020-10-01 DIAGNOSIS — M2062 Acquired deformities of toe(s), unspecified, left foot: Secondary | ICD-10-CM

## 2020-10-01 DIAGNOSIS — M79676 Pain in unspecified toe(s): Secondary | ICD-10-CM | POA: Diagnosis not present

## 2020-10-01 DIAGNOSIS — B351 Tinea unguium: Secondary | ICD-10-CM | POA: Diagnosis not present

## 2020-10-01 MED ORDER — FLUCONAZOLE 150 MG PO TABS: 150.0000 mg | ORAL_TABLET | ORAL | 2 refills | Status: DC

## 2020-10-01 NOTE — Progress Notes (Signed)
  Subjective:  Patient ID: Rachel Mcgee, female    DOB: 1950/11/18,  MRN: [id number]  Chief Complaint  Patient presents with   Nail Problem    Fungus nail on the left big toenail   Foot Pain    I have some pain with the left bunion and I can not wear shoes    70 y.o. female presents with the above complaint. History confirmed with patient.   Objective:  Physical Exam: warm, good capillary refill, no trophic changes or ulcerative lesions, normal DP and PT pulses, and normal sensory exam. Left Foot: soft tissue swelling noted over the 1st MPJ and bunion deformity noted with POP. Left hallux nail mild dystrophy distally, total nail dystrophy left 4th toenail with yellow discoloration.   No images are attached to the encounter.  Radiographs: X-ray of the left foot: no fracture, dislocation, swelling or degenerative changes noted and hallux valgus deformity Assessment:   1. Acquired deformity of left toe   2. Onychomycosis   3. Pain around toenail    Plan:  Patient was evaluated and treated and all questions answered.  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 Discussed r/b/a.  Return in about 3 months (around 01/01/2021) for Nail Fungus.

## 2020-10-27 ENCOUNTER — Ambulatory Visit: Payer: PPO | Admitting: Gastroenterology

## 2020-10-28 ENCOUNTER — Other Ambulatory Visit: Payer: Self-pay

## 2020-10-28 ENCOUNTER — Ambulatory Visit (INDEPENDENT_AMBULATORY_CARE_PROVIDER_SITE_OTHER): Payer: PPO | Admitting: Gastroenterology

## 2020-10-28 ENCOUNTER — Other Ambulatory Visit: Payer: PPO

## 2020-10-28 ENCOUNTER — Encounter: Payer: Self-pay | Admitting: Gastroenterology

## 2020-10-28 VITALS — BP 130/70 | HR 73 | Ht 63.0 in | Wt 164.0 lb

## 2020-10-28 DIAGNOSIS — K219 Gastro-esophageal reflux disease without esophagitis: Secondary | ICD-10-CM

## 2020-10-28 DIAGNOSIS — R112 Nausea with vomiting, unspecified: Secondary | ICD-10-CM

## 2020-10-28 DIAGNOSIS — R197 Diarrhea, unspecified: Secondary | ICD-10-CM

## 2020-10-28 DIAGNOSIS — Z86010 Personal history of colonic polyps: Secondary | ICD-10-CM

## 2020-10-28 MED ORDER — OMEPRAZOLE 20 MG PO CPDR: 20.0000 mg | DELAYED_RELEASE_CAPSULE | Freq: Every day | ORAL | 3 refills | Status: DC

## 2020-10-28 NOTE — Progress Notes (Signed)
Chief Complaint:   Referring Provider:  Serita Grammes, MD      ASSESSMENT AND PLAN;   #1. GERD with nausea  #2. Diarrhea. Likely IBS-D. R/O other causes.  #3. H/O polyps   Plan: -EGD/colon for further eval. -Stool studies for GI Pathogen (includes C. Diff), fecal elastase, fat and Calprotectin) -Omeprazole 72m po qd -If still with problems or prominent abdominal discomfort, would proceed with CT Abdo/pelvis. -Blood test results from Dr BJeryl Columbia -I have also instructed patient to stop smoking.    I discussed EGD/Colonoscopy- the indications, risks, alternatives and potential complications including, but not limited to, bleeding, infection, reaction to medication, damage to internal organs, cardiac and/or pulmonary problems, and perforation requiring surgery (1 to 2 in 1000). The possibility that significant findings could be missed was explained. All ? were answered. The patient gives consent to proceed.  HPI:    Rachel Mcgee a 70y.o. female  With H/O breast CA, panic disorder, anxiety/depression, CAD  With Diarrhea x several years ever since she got chemotherapy for breast cancer in 2009, getting worse over last 1 year without nocturnal symptoms.  Mostly postprandial, watery, with urgency, lower abdominal discomfort which gets better with defecation.  Has mucus but no blood.  Also complains of heartburn without odynophagia or dysphagia.  She does complain of nausea and occasional regurgitation.  Has been having generalized abdominal bloating.  She had also gotten a letter previously to get colonoscopy repeated.  She would also like to get upper GI evaluation.  She has good appetite.  No weight loss.  Denies having any jaundice dark urine or pale stools.  Recently evaluated by Dr. BJohn Giovanniblood tests.  We do not have records currently.   Past GI procedures: Colonoscopy 06/2016 (PCF-some difficulty through sigmoid colon) -Colonic polyps (10)s/p  polypectomy. Bx- TA.  Recommended to repeat in 3 years. -Moderate predominantly sigmoid diverticulosis. Past Medical History:  Diagnosis Date   ARMD (age-related macular degeneration), bilateral 10/18/2017   Breast cancer ([facility] 2009   CAD (coronary artery disease) 02/22/2019   Cardiac murmur 11/06/2018   Chest discomfort 11/06/2018   Gastroesophageal reflux disease without esophagitis    Genetic testing 06/23/2014   Negative genetic testing on the Breast/Ovarian Cancer panel.  The Breast/Ovarian gene panel offered by [facility] includes sequencing and rearrangement analysis for the following 21 genes:  ATM, BARD1, BRCA1, BRCA2, BRIP1, CDH1, CHEK2, EPCAM, FANCC, MLH1, MSH2, MSH6, NBN, PALB2, PMS2, PTEN, RAD51C, RAD51D, STK11, TP53, and XRCC2.   The report date is June 22, 2014.    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   Mixed dyslipidemia 11/06/2018   Nonspecific abnormal electrocardiogram (ECG) (EKG) 11/06/2018   Osteoporosis, postmenopausal    Recurrent major depression in partial remission ([facility]    S/P cataract extraction and insertion of intraocular lens 05/30/2017   OS 05/30/17 OD 06/13/17   S/P eye surgery 05/08/2017   2006   S/P YAG capsulotomy 11/28/2017   Tobacco abuse    Vitamin D deficiency     Past Surgical History:  Procedure Laterality Date   APPENDECTOMY  2003   BREAST LUMPECTOMY  1996   CATARACT EXTRACTION W/ INTRAOCULAR LENS IMPLANT Left 05/30/2017   CATARACT EXTRACTION W/ INTRAOCULAR LENS IMPLANT Right 06/13/2017   CESAREAN SECTION  1985   COLONOSCOPY W/ POLYPECTOMY  07/18/2016   Colonic polyps status post polypectomy. Modetate predominantly sigmoid  diverticulosis   DESCEMETS STRIPPING AUTOMATED ENDOTHELIAL KERATOPLASTY Right 08/31/2004   Dr. Helene Shoe   DESCEMETS STRIPPING AUTOMATED ENDOTHELIAL KERATOPLASTY Left 12/14/2004   Dr. Helene Shoe    Family  History  Problem Relation Age of Onset   Dementia Mother    Stroke Father    Ovarian cancer Sister 65   Alzheimer's disease Sister    Stroke Maternal Grandmother    Colon cancer Paternal Grandmother 86   Breast cancer Maternal Aunt        dx in her 32s   Colon cancer Maternal Uncle        dx in late 47s   Ovarian cancer Paternal Aunt        dx in her 83s   Melanoma Paternal Uncle    Brain cancer Paternal Uncle    Breast cancer Cousin 49   Ovarian cancer Cousin        dx in her 73s   Pancreatic cancer Neg Hx    Throat cancer Neg Hx    Liver disease Neg Hx    Stomach cancer Neg Hx     Social History   Tobacco Use   Smoking status: Every Day    Packs/day: 1.00    Years: 30.00    Pack years: 30.00    Types: Cigarettes   Smokeless tobacco: Never  Vaping Use   Vaping Use: Never used  Substance Use Topics   Alcohol use: No   Drug use: No    Current Outpatient Medications  Medication Sig Dispense Refill   Calcium-Phosphorus-Vitamin D (CALCIUM GUMMIES PO) Take by mouth daily.     Cholecalciferol (VITAMIN D3 GUMMIES) 25 MCG (1000 UT) CHEW 2 capsules daily.     DULoxetine (CYMBALTA) 30 MG capsule 1 capsule by mouth daily at PM     fluconazole (DIFLUCAN) 150 MG tablet Take 1 tablet (150 mg total) by mouth once a week. 4 tablet 2   mirtazapine (REMERON) 15 MG tablet 1/2 OF A tablet by mouth at bedtime     Multiple Vitamins-Minerals (AIRBORNE PO) Take by mouth.     rosuvastatin (CRESTOR) 40 MG tablet Take 0.5 tablets (20 mg total) by mouth daily. TK 1 T PO D 30 tablet 4   zoledronic acid (RECLAST) 5 MG/100ML SOLN injection Inject 5 mg into the vein once.     No current facility-administered medications for this visit.    No Known Allergies  Review of Systems:  Constitutional: Denies fever, chills, diaphoresis, appetite change and has fatigue.  HEENT: Denies photophobia, eye pain, redness, hearing loss, ear pain, congestion, sore throat, rhinorrhea, sneezing, mouth sores,  neck pain, neck stiffness and tinnitus.   Respiratory: Denies SOB, DOE, cough, chest tightness,  and wheezing.   Cardiovascular: Denies chest pain, palpitations and leg swelling.  Genitourinary: Denies dysuria, urgency, frequency, hematuria, flank pain and difficulty urinating.  Musculoskeletal: has myalgias, back pain, joint swelling, arthralgias and gait problem.  Skin: No rash.  Neurological: Denies dizziness, seizures, syncope, weakness, light-headedness, numbness and headaches.  Hematological: Denies adenopathy. Easy bruising, personal or family bleeding history  Psychiatric/Behavioral: Has anxiety or depression     Physical Exam:    BP 130/70   Pulse 73   Ht _0  (1.6 m)   Wt 164 lb (74.4 kg)   SpO2 95%   BMI 29.05 kg/m  Wt Readings from Last 3 Encounters:  10/28/20 164 lb (74.4 kg)  02/22/19 162 lb 12.8 oz (73.8 kg)  12/20/18 164 lb (74.4 kg)  Constitutional:  Well-developed, in no acute distress. Psychiatric: Normal mood and affect. Behavior is normal. HEENT: Pupils normal.  Conjunctivae are normal. No scleral icterus. Cardiovascular: Normal rate, regular rhythm. No edema Pulmonary/chest: Effort normal and breath sounds-decreased bilaterally. No wheezing, rales or rhonchi. Abdominal: Soft, nondistended. Nontender. Bowel sounds active throughout. There are no masses palpable. No hepatomegaly. Rectal: Deferred Neurological: Alert and oriented to person place and time. Skin: Skin is warm and dry. No rashes noted.  Data Reviewed: I have personally reviewed following labs and imaging studies  CBC: No flowsheet data found.  CMP: CMP Latest Ref Rng & Units 04/03/2019 02/08/2019  Glucose 65 - 99 mg/dL - 106(H)  BUN 8 - 27 mg/dL - 13  Creatinine 0.57 - 1.00 mg/dL - 0.81  Sodium 134 - 144 mmol/L - 142  Potassium 3.5 - 5.2 mmol/L - 4.0  Chloride 96 - 106 mmol/L - 104  CO2 20 - 29 mmol/L - 25  Calcium 8.7 - 10.3 mg/dL - 9.6  Total Protein 6.0 - 8.5 g/dL 6.6 7.0  Total  Bilirubin 0.0 - 1.2 mg/dL 0.2 0.2  Alkaline Phos 39 - 117 IU/L 128(H) 136(H)  AST 0 - 40 IU/L 12 12  ALT 0 - 32 IU/L 11 10        Carmell Austria, MD 10/28/2020, 11:26 AM  Cc: Serita Grammes, MD

## 2020-10-28 NOTE — Patient Instructions (Addendum)
If you are age 71 or older, your body mass index should be between 23-30. Your Body mass index is 29.05 kg/m. If this is out of the aforementioned range listed, please consider follow up with your Primary Care Provider.  If you are age 16 or younger, your body mass index should be between 19-25. Your Body mass index is 29.05 kg/m. If this is out of the aformentioned range listed, please consider follow up with your Primary Care Provider.   __________________________________________________________  The [facility] providers would like to encourage you to use [facility] to communicate with providers for non-urgent requests or questions.  Due to long hold times on the telephone, sending your provider a message by [facility] may be a faster and more efficient way to get a response.  Please allow 48 business hours for a response.  Please remember that this is for non-urgent requests.   Please go to the lab on the 2nd floor [address] before you leave the office today.   We have sent the following medications to your pharmacy for you to pick up at your convenience: Omeprazole  Thank you,  Dr. Jackquline Denmark

## 2020-11-09 DIAGNOSIS — F3341 Major depressive disorder, recurrent, in partial remission: Secondary | ICD-10-CM | POA: Diagnosis not present

## 2020-11-09 DIAGNOSIS — Z0000 Encounter for general adult medical examination without abnormal findings: Secondary | ICD-10-CM | POA: Diagnosis not present

## 2020-11-09 DIAGNOSIS — Z131 Encounter for screening for diabetes mellitus: Secondary | ICD-10-CM | POA: Diagnosis not present

## 2020-11-09 DIAGNOSIS — K219 Gastro-esophageal reflux disease without esophagitis: Secondary | ICD-10-CM | POA: Diagnosis not present

## 2020-11-09 DIAGNOSIS — E782 Mixed hyperlipidemia: Secondary | ICD-10-CM | POA: Diagnosis not present

## 2020-11-09 DIAGNOSIS — M810 Age-related osteoporosis without current pathological fracture: Secondary | ICD-10-CM | POA: Diagnosis not present

## 2020-11-09 DIAGNOSIS — Z6829 Body mass index (BMI) 29.0-29.9, adult: Secondary | ICD-10-CM | POA: Diagnosis not present

## 2020-11-09 DIAGNOSIS — Z79899 Other long term (current) drug therapy: Secondary | ICD-10-CM | POA: Diagnosis not present

## 2020-12-09 DIAGNOSIS — Z9889 Other specified postprocedural states: Secondary | ICD-10-CM | POA: Diagnosis not present

## 2020-12-09 DIAGNOSIS — H18513 Endothelial corneal dystrophy, bilateral: Secondary | ICD-10-CM | POA: Diagnosis not present

## 2020-12-09 DIAGNOSIS — Z961 Presence of intraocular lens: Secondary | ICD-10-CM | POA: Diagnosis not present

## 2020-12-09 DIAGNOSIS — Z9841 Cataract extraction status, right eye: Secondary | ICD-10-CM | POA: Diagnosis not present

## 2020-12-09 DIAGNOSIS — H18512 Endothelial corneal dystrophy, left eye: Secondary | ICD-10-CM | POA: Diagnosis not present

## 2020-12-09 DIAGNOSIS — Z79899 Other long term (current) drug therapy: Secondary | ICD-10-CM | POA: Diagnosis not present

## 2020-12-09 DIAGNOSIS — Z9842 Cataract extraction status, left eye: Secondary | ICD-10-CM | POA: Diagnosis not present

## 2020-12-17 ENCOUNTER — Ambulatory Visit (AMBULATORY_SURGERY_CENTER): Payer: PPO | Admitting: Gastroenterology

## 2020-12-17 ENCOUNTER — Encounter: Payer: Self-pay | Admitting: Gastroenterology

## 2020-12-17 ENCOUNTER — Other Ambulatory Visit: Payer: Self-pay

## 2020-12-17 VITALS — BP 186/82 | HR 68 | Temp 97.8°F | Resp 13 | Ht 63.0 in | Wt 164.0 lb

## 2020-12-17 DIAGNOSIS — D120 Benign neoplasm of cecum: Secondary | ICD-10-CM | POA: Diagnosis not present

## 2020-12-17 DIAGNOSIS — D123 Benign neoplasm of transverse colon: Secondary | ICD-10-CM | POA: Diagnosis not present

## 2020-12-17 DIAGNOSIS — R1084 Generalized abdominal pain: Secondary | ICD-10-CM

## 2020-12-17 DIAGNOSIS — Z86010 Personal history of colonic polyps: Secondary | ICD-10-CM

## 2020-12-17 DIAGNOSIS — K31A0 Gastric intestinal metaplasia, unspecified: Secondary | ICD-10-CM

## 2020-12-17 DIAGNOSIS — K2960 Other gastritis without bleeding: Secondary | ICD-10-CM | POA: Diagnosis not present

## 2020-12-17 DIAGNOSIS — K635 Polyp of colon: Secondary | ICD-10-CM | POA: Diagnosis not present

## 2020-12-17 DIAGNOSIS — K2970 Gastritis, unspecified, without bleeding: Secondary | ICD-10-CM | POA: Diagnosis not present

## 2020-12-17 DIAGNOSIS — D125 Benign neoplasm of sigmoid colon: Secondary | ICD-10-CM

## 2020-12-17 DIAGNOSIS — D124 Benign neoplasm of descending colon: Secondary | ICD-10-CM

## 2020-12-17 DIAGNOSIS — K648 Other hemorrhoids: Secondary | ICD-10-CM | POA: Diagnosis not present

## 2020-12-17 DIAGNOSIS — R197 Diarrhea, unspecified: Secondary | ICD-10-CM | POA: Diagnosis not present

## 2020-12-17 DIAGNOSIS — I2510 Atherosclerotic heart disease of native coronary artery without angina pectoris: Secondary | ICD-10-CM | POA: Diagnosis not present

## 2020-12-17 DIAGNOSIS — K5730 Diverticulosis of large intestine without perforation or abscess without bleeding: Secondary | ICD-10-CM | POA: Diagnosis not present

## 2020-12-17 DIAGNOSIS — K6389 Other specified diseases of intestine: Secondary | ICD-10-CM | POA: Diagnosis not present

## 2020-12-17 DIAGNOSIS — K219 Gastro-esophageal reflux disease without esophagitis: Secondary | ICD-10-CM | POA: Diagnosis not present

## 2020-12-17 DIAGNOSIS — B9681 Helicobacter pylori [H. pylori] as the cause of diseases classified elsewhere: Secondary | ICD-10-CM | POA: Diagnosis not present

## 2020-12-17 MED ORDER — SODIUM CHLORIDE 0.9 % IV SOLN: 500.0000 mL | Freq: Once | INTRAVENOUS | Status: DC

## 2020-12-17 MED ORDER — PANTOPRAZOLE SODIUM 40 MG PO TBEC: 40.0000 mg | DELAYED_RELEASE_TABLET | Freq: Every day | ORAL | 11 refills | Status: DC

## 2020-12-17 NOTE — Progress Notes (Signed)
DT VS and JF IV.

## 2020-12-17 NOTE — Op Note (Addendum)
[place] Patient Name: Rachel Mcgee Procedure Date: 12/17/2020 9:31 AM MRN: [id number] Endoscopist: Jackquline Denmark , MD Age: 70 Referring MD:  Date of Birth: 1950-07-11 Gender: Female Account #: [id number] Procedure:                Upper GI endoscopy Indications:              Epigastric abdominal pain. GERD Medicines:                Monitored Anesthesia Care Procedure:                Pre-Anesthesia Assessment:                           - Prior to the procedure, a History and Physical                            was performed, and patient medications and                            allergies were reviewed. The patient's tolerance of                            previous anesthesia was also reviewed. The risks                            and benefits of the procedure and the sedation                            options and risks were discussed with the patient.                            All questions were answered, and informed consent                            was obtained. Prior Anticoagulants: The patient has                            taken no previous anticoagulant or antiplatelet                            agents. ASA Grade Assessment: III - A patient with                            severe systemic disease. After reviewing the risks                            and benefits, the patient was deemed in                            satisfactory condition to undergo the procedure.                           After obtaining informed consent, the endoscope was  passed under direct vision. Throughout the                            procedure, the patient's blood pressure, pulse, and                            oxygen saturations were monitored continuously. The                            Endoscope was introduced through the mouth, and                            advanced to the second part of duodenum. The upper                            GI endoscopy was  accomplished without difficulty.                            The patient tolerated the procedure well. Scope In: Scope Out: Findings:                 LA Grade A (one or more mucosal breaks less than 5                            mm, not extending between tops of 2 mucosal folds)                            esophagitis with no bleeding was found 36 cm from                            the incisors. Biopsies were taken with a cold                            forceps for histology.                           Localized moderate inflammation characterized by                            erosions, friability and granularity was found in                            the gastric antrum. Biopsies were taken with a cold                            forceps for histology.                           One 1 cm antral nodule was noted with a superficial                            erosion (? inflammatory). Multiple Biopsies were  taken with a cold forceps for histology using                            tunnel technique.                           The examined duodenum was normal. Biopsies for                            histology were taken with a cold forceps for                            evaluation of celiac disease. Complications:            No immediate complications. Estimated Blood Loss:     Estimated blood loss: none. Impression:               - LA Grade A reflux esophagitis with no bleeding.                            Biopsied.                           - Gastritis. Biopsied.                           - Antral nodule vs inflammatory polyp. Biopsied. Recommendation:           - Patient has a contact number available for                            emergencies. The signs and symptoms of potential                            delayed complications were discussed with the                            patient. Return to normal activities tomorrow.                            Written discharge  instructions were provided to the                            patient.                           - Resume previous diet.                           - Use Protonix (pantoprazole) 40 mg PO daily #30,                            11 refills.                           - Resume previous diet.                           -  Await pathology results.                           - Avoid ibuprofen, naproxen, or other non-steroidal                            anti-inflammatory drugs.                           - If still with problems, proceed with CT                            Abdo/pelvis with p.o. and IV contrast.                           - If antral lesion needs to be evaluated further,                            would recommend EUS at a later date.                           - The findings and recommendations were discussed                            with the patient's family. Jackquline Denmark, MD 12/17/2020 11:01:26 AM This report has been signed electronically.

## 2020-12-17 NOTE — Progress Notes (Signed)
To PACU, vss. Report to [email or web address] 

## 2020-12-17 NOTE — Progress Notes (Signed)
To PACU, VSS. Report to [email or web address] 

## 2020-12-17 NOTE — Patient Instructions (Signed)
Polyps handout given Resume previous diet Use protonix 40mg  once daily Avoid:  ibuprofen, naproxen, aspirin, goody powder, excedrin or any other non steroidal anti-inflammatory drugs  YOU HAD AN ENDOSCOPIC PROCEDURE TODAY AT [place]:   Refer to the procedure report that was given to you for any specific questions about what was found during the examination.  If the procedure report does not answer your questions, please call your gastroenterologist to clarify.  If you requested that your care partner not be given the details of your procedure findings, then the procedure report has been included in a sealed envelope for you to review at your convenience later.  YOU SHOULD EXPECT: Some feelings of bloating in the abdomen. Passage of more gas than usual.  Walking can help get rid of the air that was put into your GI tract during the procedure and reduce the bloating. If you had a lower endoscopy (such as a colonoscopy or flexible sigmoidoscopy) you may notice spotting of blood in your stool or on the toilet paper. If you underwent a bowel prep for your procedure, you may not have a normal bowel movement for a few days.  Please Note:  You might notice some irritation and congestion in your nose or some drainage.  This is from the oxygen used during your procedure.  There is no need for concern and it should clear up in a day or so.  SYMPTOMS TO REPORT IMMEDIATELY:  Following lower endoscopy (colonoscopy or flexible sigmoidoscopy):  Excessive amounts of blood in the stool  Significant tenderness or worsening of abdominal pains  Swelling of the abdomen that is new, acute  Fever of 100F or higher  Following upper endoscopy (EGD)  Vomiting of blood or coffee ground material  New chest pain or pain under the shoulder blades  Painful or persistently difficult swallowing  New shortness of breath  Fever of 100F or higher  Black, tarry-looking stools  For urgent or emergent  issues, a gastroenterologist can be reached at any hour by calling [phone]. Do not use MyChart messaging for urgent concerns.   DIET:  We do recommend a small meal at first, but then you may proceed to your regular diet.  Drink plenty of fluids but you should avoid alcoholic beverages for 24 hours.  ACTIVITY:  You should plan to take it easy for the rest of today and you should NOT DRIVE or use heavy machinery until tomorrow (because of the sedation medicines used during the test).    FOLLOW UP: Our staff will call the number listed on your records 48-72 hours following your procedure to check on you and address any questions or concerns that you may have regarding the information given to you following your procedure. If we do not reach you, we will leave a message.  We will attempt to reach you two times.  During this call, we will ask if you have developed any symptoms of COVID 19. If you develop any symptoms (ie: fever, flu-like symptoms, shortness of breath, cough etc.) before then, please call [phone].  If you test positive for Covid 19 in the 2 weeks post procedure, please call and report this information to [place].    If any biopsies were taken you will be contacted by phone or by letter within the next 1-3 weeks.  Please call us at [phone] if you have not heard about the biopsies in 3 weeks.   SIGNATURES/CONFIDENTIALITY: You and/or your care partner have signed  paperwork which will be entered into your electronic medical record.  These signatures attest to the fact that that the information above on your After Visit Summary has been reviewed and is understood.  Full responsibility of the confidentiality of this discharge information lies with you and/or your care-partner.

## 2020-12-17 NOTE — Op Note (Signed)
[place] Patient Name: Rachel Mcgee Procedure Date: 12/17/2020 9:31 AM MRN: [id number] Endoscopist: Jackquline Denmark , MD Age: 70 Referring MD:  Date of Birth: [birth year] Gender: Female Account #: [id number] Procedure:                Colonoscopy Indications:              Generalized abdominal pain, Clinically significant                            diarrhea of unexplained origin Medicines:                Monitored Anesthesia Care Procedure:                Pre-Anesthesia Assessment:                           - Prior to the procedure, a History and Physical                            was performed, and patient medications and                            allergies were reviewed. The patient's tolerance of                            previous anesthesia was also reviewed. The risks                            and benefits of the procedure and the sedation                            options and risks were discussed with the patient.                            All questions were answered, and informed consent                            was obtained. Prior Anticoagulants: The patient has                            taken no previous anticoagulant or antiplatelet                            agents. ASA Grade Assessment: II - A patient with                            mild systemic disease. After reviewing the risks                            and benefits, the patient was deemed in                            satisfactory condition to undergo the procedure.  After obtaining informed consent, the colonoscope                            was passed under direct vision. Throughout the                            procedure, the patient's blood pressure, pulse, and                            oxygen saturations were monitored continuously. The                            PCF-HQ190L Colonoscope was introduced through the                            anus and advanced to the 1 cm  into the ileum. The                            colonoscopy was performed without difficulty. The                            patient tolerated the procedure well. The quality                            of the bowel preparation was good. The ileocecal                            valve, appendiceal orifice, and rectum were                            photographed. Scope In: 10:25:37 AM Scope Out: 10:54:02 AM Scope Withdrawal Time: 0 hours 20 minutes 3 seconds  Total Procedure Duration: 0 hours 28 minutes 25 seconds  Findings:                 A 10 mm polyp was found in the cecum. The polyp was                            sessile. The polyp was removed with a piecemeal                            technique using a hot snare. Resection and                            retrieval were complete. Estimated blood loss: none.                           Four sessile polyps were found in the distal                            sigmoid colon, proximal descending colon and mid                            transverse colon. The  polyps were 4 to 8 mm in                            size. Three smaller polyps were removed with a cold                            snare and the larger sigmoid polyp (8 mm) with hot                            snare. Resection and retrieval were complete.                            Estimated blood loss: none.                           The colon (entire examined portion) appeared                            normal. Biopsies for histology were taken with a                            cold forceps from the entire colon for evaluation                            of microscopic colitis.                           Multiple medium-mouthed diverticula were found in                            the sigmoid colon.                           Non-bleeding internal hemorrhoids were found during                            retroflexion. The hemorrhoids were small.                           The terminal ileum  appeared normal.                           The exam was otherwise without abnormality on                            direct and retroflexion views. Complications:            No immediate complications. Estimated Blood Loss:     Estimated blood loss: none. Impression:               - Colonic polyps s/p polypectomy.                           - Moderate sigmoid diverticulosis.                           -  Otherwise normal colonoscopy to TI.                           - S/P Random colon biopsies. Recommendation:           - Patient has a contact number available for                            emergencies. The signs and symptoms of potential                            delayed complications were discussed with the                            patient. Return to normal activities tomorrow.                            Written discharge instructions were provided to the                            patient.                           - Resume previous diet.                           - Continue present medications.                           - Await pathology results.                           - Repeat colonoscopy for surveillance based on                            pathology results.                           - If still with problems, further work-up.                           - The findings and recommendations were discussed                            with the patient's family. Jackquline Denmark, MD 12/17/2020 11:08:10 AM This report has been signed electronically.

## 2020-12-17 NOTE — Progress Notes (Signed)
Called to room to assist during endoscopic procedure.  Patient ID and intended procedure confirmed with present staff. Received instructions for my participation in the procedure from the performing physician.  

## 2020-12-17 NOTE — Progress Notes (Signed)
ASSESSMENT AND PLAN;   #1. GERD with nausea  #2. Diarrhea. Likely IBS-D. R/O other causes.  #3. H/O polyps   Plan: -EGD/colon for further eval.  HPI:    Rachel Mcgee is a 70 y.o. female  With H/O breast CA, panic disorder, anxiety/depression, CAD  With Diarrhea x several years ever since she got chemotherapy for breast cancer in 2009, getting worse over last 1 year without nocturnal symptoms.  Mostly postprandial, watery, with urgency, lower abdominal discomfort which gets better with defecation.  Has mucus but no blood.  Also complains of heartburn without odynophagia or dysphagia.  She does complain of nausea and occasional regurgitation.  Has been having generalized abdominal bloating.  She had also gotten a letter previously to get colonoscopy repeated.  She would also like to get upper GI evaluation.  She has good appetite.  No weight loss.  Denies having any jaundice dark urine or pale stools.  Recently evaluated by Dr. John Giovanni blood tests.  We do not have records currently.   Past GI procedures: Colonoscopy 06/2016 (PCF-some difficulty through sigmoid colon) -Colonic polyps (10)s/p polypectomy. Bx- TA.  Recommended to repeat in 3 years. -Moderate predominantly sigmoid diverticulosis. Past Medical History:  Diagnosis Date   ARMD (age-related macular degeneration), bilateral 10/18/2017   Breast cancer ([place]) 2009   CAD (coronary artery disease) 02/22/2019   Cardiac murmur 11/06/2018   Chest discomfort 11/06/2018   Gastroesophageal reflux disease without esophagitis    Genetic testing 06/23/2014   Negative genetic testing on the Breast/Ovarian Cancer panel.  The Breast/Ovarian gene panel offered by [facility] includes sequencing and rearrangement analysis for the following 21 genes:  ATM, BARD1, BRCA1, BRCA2, BRIP1, CDH1, CHEK2, EPCAM, FANCC, MLH1, MSH2, MSH6, NBN, PALB2, PMS2, PTEN, RAD51C, RAD51D, STK11, TP53, and XRCC2.   The report date is June 22, 2014.     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   Mixed dyslipidemia 11/06/2018   Nonspecific abnormal electrocardiogram (ECG) (EKG) 11/06/2018   Osteoporosis, postmenopausal    Recurrent major depression in partial remission ([place])    S/P cataract extraction and insertion of intraocular lens 05/30/2017   OS 05/30/17 OD 06/13/17   S/P eye surgery 05/08/2017   2006   S/P YAG capsulotomy 11/28/2017   Tobacco abuse    Vitamin D deficiency     Past Surgical History:  Procedure Laterality Date   APPENDECTOMY  2003   BREAST LUMPECTOMY  1996   CATARACT EXTRACTION W/ INTRAOCULAR LENS IMPLANT Left 05/30/2017   CATARACT EXTRACTION W/ INTRAOCULAR LENS IMPLANT Right 06/13/2017   CESAREAN SECTION  1985   COLONOSCOPY W/ POLYPECTOMY  07/18/2016   Colonic polyps status post polypectomy. Modetate predominantly sigmoid diverticulosis   DESCEMETS STRIPPING AUTOMATED ENDOTHELIAL KERATOPLASTY Right 08/31/2004   Dr. Helene Shoe   DESCEMETS STRIPPING AUTOMATED ENDOTHELIAL KERATOPLASTY Left 12/14/2004   Dr. Helene Shoe    Family History  Problem Relation Age of Onset   Dementia Mother    Stroke Father    Ovarian cancer Sister 7   Alzheimer's disease Sister    Stroke Maternal Grandmother    Colon cancer Paternal Grandmother 68   Breast cancer Maternal Aunt        dx in her 5s   Colon cancer Maternal Uncle        dx in late 60s   Ovarian cancer Paternal Aunt  dx in her 28s   Melanoma Paternal Uncle    Brain cancer Paternal Uncle    Breast cancer Cousin 8   Ovarian cancer Cousin        dx in her 98s   Pancreatic cancer Neg Hx    Throat cancer Neg Hx    Liver disease Neg Hx    Stomach cancer Neg Hx     Social History   Tobacco Use   Smoking status: Every Day    Packs/day: 1.00    Years: 30.00    Pack years: 30.00    Types: Cigarettes   Smokeless tobacco: Never  Vaping Use    Vaping Use: Never used  Substance Use Topics   Alcohol use: No   Drug use: No    Current Outpatient Medications  Medication Sig Dispense Refill   DULoxetine (CYMBALTA) 30 MG capsule 1 capsule by mouth daily at PM     fluconazole (DIFLUCAN) 150 MG tablet Take 1 tablet (150 mg total) by mouth once a week. 4 tablet 2   mirtazapine (REMERON) 15 MG tablet 1/2 OF A tablet by mouth at bedtime     Multiple Vitamins-Minerals (AIRBORNE PO) Take by mouth.     rosuvastatin (CRESTOR) 40 MG tablet Take 0.5 tablets (20 mg total) by mouth daily. TK 1 T PO D 30 tablet 4   tobramycin-dexamethasone (TOBRADEX) ophthalmic solution Place 1 drop into the left eye 4 times daily for 14 days.     Calcium-Phosphorus-Vitamin D (CALCIUM GUMMIES PO) Take by mouth daily.     Cholecalciferol (VITAMIN D3 GUMMIES) 25 MCG (1000 UT) CHEW 2 capsules daily.     omeprazole (PRILOSEC) 20 MG capsule Take 1 capsule (20 mg total) by mouth daily. 90 capsule 3   zoledronic acid (RECLAST) 5 MG/100ML SOLN injection Inject 5 mg into the vein once.     Current Facility-Administered Medications  Medication Dose Route Frequency Provider Last Rate Last Admin   0.9 %  sodium chloride infusion  500 mL Intravenous Once Jackquline Denmark, MD        No Known Allergies  Review of Systems:  Constitutional: Denies fever, chills, diaphoresis, appetite change and has fatigue.  HEENT: Denies photophobia, eye pain, redness, hearing loss, ear pain, congestion, sore throat, rhinorrhea, sneezing, mouth sores, neck pain, neck stiffness and tinnitus.   Respiratory: Denies SOB, DOE, cough, chest tightness,  and wheezing.   Cardiovascular: Denies chest pain, palpitations and leg swelling.  Genitourinary: Denies dysuria, urgency, frequency, hematuria, flank pain and difficulty urinating.  Musculoskeletal: has myalgias, back pain, joint swelling, arthralgias and gait problem.  Skin: No rash.  Neurological: Denies dizziness, seizures, syncope, weakness,  light-headedness, numbness and headaches.  Hematological: Denies adenopathy. Easy bruising, personal or family bleeding history  Psychiatric/Behavioral: Has anxiety or depression     Physical Exam:    BP 140/67   Pulse 80   Temp 97.8 F (36.6 C)   Ht 5' 3" (1.6 m)   Wt 164 lb (74.4 kg)   SpO2 97%   BMI 29.05 kg/m  Wt Readings from Last 3 Encounters:  12/17/20 164 lb (74.4 kg)  10/28/20 164 lb (74.4 kg)  02/22/19 162 lb 12.8 oz (73.8 kg)   Constitutional:  Well-developed, in no acute distress. Psychiatric: Normal mood and affect. Behavior is normal. HEENT: Pupils normal.  Conjunctivae are normal. No scleral icterus. Cardiovascular: Normal rate, regular rhythm. No edema Pulmonary/chest: Effort normal and breath sounds-decreased bilaterally. No wheezing, rales or rhonchi. Abdominal: Soft, nondistended. Nontender.  Bowel sounds active throughout. There are no masses palpable. No hepatomegaly. Rectal: Deferred Neurological: Alert and oriented to person place and time. Skin: Skin is warm and dry. No rashes noted.  Data Reviewed: I have personally reviewed following labs and imaging studies  CBC: No flowsheet data found.  CMP: CMP Latest Ref Rng & Units 04/03/2019 02/08/2019  Glucose 65 - 99 mg/dL - 106(H)  BUN 8 - 27 mg/dL - 13  Creatinine 0.57 - 1.00 mg/dL - 0.81  Sodium 134 - 144 mmol/L - 142  Potassium 3.5 - 5.2 mmol/L - 4.0  Chloride 96 - 106 mmol/L - 104  CO2 20 - 29 mmol/L - 25  Calcium 8.7 - 10.3 mg/dL - 9.6  Total Protein 6.0 - 8.5 g/dL 6.6 7.0  Total Bilirubin 0.0 - 1.2 mg/dL 0.2 0.2  Alkaline Phos 39 - 117 IU/L 128(H) 136(H)  AST 0 - 40 IU/L 12 12  ALT 0 - 32 IU/L 11 10        Carmell Austria, MD 12/17/2020, 10:02 AM  Cc: Serita Grammes, MD

## 2020-12-21 ENCOUNTER — Telehealth: Payer: Self-pay | Admitting: *Deleted

## 2020-12-21 ENCOUNTER — Telehealth: Payer: Self-pay

## 2020-12-21 NOTE — Telephone Encounter (Signed)
Second attempt follow up call to pt, no answer, no vm set up.

## 2020-12-21 NOTE — Telephone Encounter (Signed)
Attempted to call patient for post-procedure follow-up call. Unable to reach patient or leave a voicemail.

## 2020-12-28 ENCOUNTER — Encounter: Payer: Self-pay | Admitting: Gastroenterology

## 2020-12-28 ENCOUNTER — Other Ambulatory Visit: Payer: Self-pay | Admitting: Podiatry

## 2020-12-28 ENCOUNTER — Other Ambulatory Visit: Payer: Self-pay

## 2020-12-28 DIAGNOSIS — B9681 Helicobacter pylori [H. pylori] as the cause of diseases classified elsewhere: Secondary | ICD-10-CM

## 2020-12-28 DIAGNOSIS — K2970 Gastritis, unspecified, without bleeding: Secondary | ICD-10-CM

## 2020-12-28 MED ORDER — METRONIDAZOLE 500 MG PO TABS: 500.0000 mg | ORAL_TABLET | Freq: Two times a day (BID) | ORAL | 0 refills | Status: AC

## 2020-12-28 MED ORDER — PANTOPRAZOLE SODIUM 40 MG PO TBEC: 40.0000 mg | DELAYED_RELEASE_TABLET | Freq: Two times a day (BID) | ORAL | 0 refills | Status: DC

## 2020-12-28 MED ORDER — CLARITHROMYCIN 500 MG PO TABS: 500.0000 mg | ORAL_TABLET | Freq: Two times a day (BID) | ORAL | 0 refills | Status: AC

## 2020-12-28 MED ORDER — AMOXICILLIN 500 MG PO CAPS: 1000.0000 mg | ORAL_CAPSULE | Freq: Two times a day (BID) | ORAL | 0 refills | Status: AC

## 2020-12-28 NOTE — Telephone Encounter (Signed)
Please advise 

## 2020-12-28 NOTE — Progress Notes (Signed)
Rachel Mcgee, can you let the pt know that gastric bx are + for H pylori. Would recommend the following treatment regimen for 14 days: Amoxicillin 1gm BID Clarithromycin 500mg  BID Flagyl 500mg  BID Protonix 40 BID   Can you please order this if no signfiicant interactions noted. Once done with therapy, the patient should wait one month and perform a stool study for H pylori antigen to ensure negative. The PPI needs to be held at least 2 weeks prior to submitting the stool sample. The patient should avoid alcohol while taking Flagyl.   Thanks  Dr Lyndel Safe

## 2020-12-31 ENCOUNTER — Ambulatory Visit (INDEPENDENT_AMBULATORY_CARE_PROVIDER_SITE_OTHER): Payer: PPO | Admitting: Podiatry

## 2020-12-31 ENCOUNTER — Encounter: Payer: Self-pay | Admitting: Podiatry

## 2020-12-31 ENCOUNTER — Other Ambulatory Visit: Payer: Self-pay

## 2020-12-31 DIAGNOSIS — B351 Tinea unguium: Secondary | ICD-10-CM

## 2020-12-31 DIAGNOSIS — M79676 Pain in unspecified toe(s): Secondary | ICD-10-CM

## 2021-01-04 NOTE — Progress Notes (Signed)
  Subjective:  Patient ID: Rachel Mcgee, female    DOB: April 25, 1950,  MRN: [id number]  Chief Complaint  Patient presents with   Nail Problem    I think that the medicine did help with the toenails     70 y.o. female presents with the above complaint. History confirmed with patient.   Objective:  Physical Exam: warm, good capillary refill, no trophic changes or ulcerative lesions, normal DP and PT pulses, and normal sensory exam. Left Foot: soft tissue swelling noted over the 1st MPJ and bunion deformity noted with POP. Left hallux nail mild dystrophy distally, total nail dystrophy left 4th toenail with yellow discoloration.   Assessment:   1. Onychomycosis   2. Pain around toenail     Plan:  Patient was evaluated and treated and all questions answered.  Bunion -If pain persists can discuss injection or possible surgical intervention  Nail Fungus left hallux, 4th toe -Continue fluconazole. Nails debrided, courtesy. F/u should issues persist.  No follow-ups on file.

## 2021-01-22 ENCOUNTER — Telehealth: Payer: Self-pay | Admitting: Gastroenterology

## 2021-01-22 NOTE — Telephone Encounter (Signed)
Patient called states she was feeling really sick even with taking the antibiotics patient said she stopped the medicine and started feeling worst seeking advise.

## 2021-01-25 ENCOUNTER — Other Ambulatory Visit: Payer: Self-pay

## 2021-01-25 DIAGNOSIS — I700 Atherosclerosis of aorta: Secondary | ICD-10-CM | POA: Diagnosis not present

## 2021-01-25 DIAGNOSIS — K8050 Calculus of bile duct without cholangitis or cholecystitis without obstruction: Secondary | ICD-10-CM | POA: Diagnosis not present

## 2021-01-25 DIAGNOSIS — R7401 Elevation of levels of liver transaminase levels: Secondary | ICD-10-CM | POA: Diagnosis not present

## 2021-01-25 DIAGNOSIS — R1010 Upper abdominal pain, unspecified: Secondary | ICD-10-CM | POA: Diagnosis not present

## 2021-01-25 DIAGNOSIS — Q791 Other congenital malformations of diaphragm: Secondary | ICD-10-CM | POA: Diagnosis not present

## 2021-01-25 DIAGNOSIS — B9681 Helicobacter pylori [H. pylori] as the cause of diseases classified elsewhere: Secondary | ICD-10-CM

## 2021-01-25 NOTE — Telephone Encounter (Signed)
She took 8 of 14 days Lets wait for 4 weeks Check stool for H. pylori antigen off PPIs x 2 weeks RG

## 2021-01-25 NOTE — Telephone Encounter (Signed)
Patient is returning your call.  

## 2021-01-25 NOTE — Telephone Encounter (Signed)
Pt stated that the she  has STOPPED taking ALL  her medication that was prescribed to her for the treatment of H. Pylori. Pt states that she started to develop thrush in the mouth from the Amoxicillin;  Pt states that she also started having Diarrhea and  Severe abdominal pain from the medications  Pt states that the thrush and abdominal pain are better although she still is experiencing some diarrhea. Pt states that she had three loose BM yesterday. Pt states that she took the medication 8 days out of the 14 days prescribed to her.  Please advise on Treatment of H Pylori

## 2021-01-25 NOTE — Telephone Encounter (Signed)
Left message for pt to call back  °

## 2021-01-25 NOTE — Telephone Encounter (Signed)
Pt notified of Dr. Lyndel Safe recommendations.  Orders for labs are in Epic Pt verbalized understanding with all questions answered.

## 2021-01-26 DIAGNOSIS — N179 Acute kidney failure, unspecified: Secondary | ICD-10-CM | POA: Diagnosis not present

## 2021-01-26 DIAGNOSIS — E860 Dehydration: Secondary | ICD-10-CM | POA: Diagnosis not present

## 2021-01-26 DIAGNOSIS — F17210 Nicotine dependence, cigarettes, uncomplicated: Secondary | ICD-10-CM | POA: Diagnosis not present

## 2021-01-26 DIAGNOSIS — Z20822 Contact with and (suspected) exposure to covid-19: Secondary | ICD-10-CM | POA: Diagnosis not present

## 2021-01-26 DIAGNOSIS — I700 Atherosclerosis of aorta: Secondary | ICD-10-CM | POA: Diagnosis not present

## 2021-01-26 DIAGNOSIS — F32A Depression, unspecified: Secondary | ICD-10-CM | POA: Diagnosis not present

## 2021-01-26 DIAGNOSIS — F419 Anxiety disorder, unspecified: Secondary | ICD-10-CM | POA: Diagnosis not present

## 2021-01-26 DIAGNOSIS — Q791 Other congenital malformations of diaphragm: Secondary | ICD-10-CM | POA: Diagnosis not present

## 2021-01-26 DIAGNOSIS — E876 Hypokalemia: Secondary | ICD-10-CM | POA: Diagnosis not present

## 2021-01-26 DIAGNOSIS — Z923 Personal history of irradiation: Secondary | ICD-10-CM | POA: Diagnosis not present

## 2021-01-26 DIAGNOSIS — R509 Fever, unspecified: Secondary | ICD-10-CM | POA: Diagnosis not present

## 2021-01-26 DIAGNOSIS — A4159 Other Gram-negative sepsis: Secondary | ICD-10-CM | POA: Diagnosis not present

## 2021-01-26 DIAGNOSIS — R1013 Epigastric pain: Secondary | ICD-10-CM | POA: Diagnosis not present

## 2021-01-26 DIAGNOSIS — Z87442 Personal history of urinary calculi: Secondary | ICD-10-CM | POA: Diagnosis not present

## 2021-01-26 DIAGNOSIS — E7800 Pure hypercholesterolemia, unspecified: Secondary | ICD-10-CM | POA: Diagnosis not present

## 2021-01-26 DIAGNOSIS — Z9221 Personal history of antineoplastic chemotherapy: Secondary | ICD-10-CM | POA: Diagnosis not present

## 2021-01-26 DIAGNOSIS — R7401 Elevation of levels of liver transaminase levels: Secondary | ICD-10-CM | POA: Diagnosis not present

## 2021-01-26 DIAGNOSIS — R0902 Hypoxemia: Secondary | ICD-10-CM | POA: Diagnosis not present

## 2021-01-26 DIAGNOSIS — Z79899 Other long term (current) drug therapy: Secondary | ICD-10-CM | POA: Diagnosis not present

## 2021-01-26 DIAGNOSIS — R7989 Other specified abnormal findings of blood chemistry: Secondary | ICD-10-CM | POA: Diagnosis not present

## 2021-01-26 DIAGNOSIS — K828 Other specified diseases of gallbladder: Secondary | ICD-10-CM | POA: Diagnosis not present

## 2021-01-26 DIAGNOSIS — Z853 Personal history of malignant neoplasm of breast: Secondary | ICD-10-CM | POA: Diagnosis not present

## 2021-01-26 DIAGNOSIS — E785 Hyperlipidemia, unspecified: Secondary | ICD-10-CM | POA: Diagnosis not present

## 2021-01-26 DIAGNOSIS — K8050 Calculus of bile duct without cholangitis or cholecystitis without obstruction: Secondary | ICD-10-CM | POA: Diagnosis not present

## 2021-01-26 DIAGNOSIS — K5730 Diverticulosis of large intestine without perforation or abscess without bleeding: Secondary | ICD-10-CM | POA: Diagnosis not present

## 2021-01-26 DIAGNOSIS — R1010 Upper abdominal pain, unspecified: Secondary | ICD-10-CM | POA: Diagnosis not present

## 2021-02-08 DIAGNOSIS — A419 Sepsis, unspecified organism: Secondary | ICD-10-CM | POA: Diagnosis not present

## 2021-02-08 DIAGNOSIS — Z6829 Body mass index (BMI) 29.0-29.9, adult: Secondary | ICD-10-CM | POA: Diagnosis not present

## 2021-02-08 DIAGNOSIS — R7401 Elevation of levels of liver transaminase levels: Secondary | ICD-10-CM | POA: Diagnosis not present

## 2021-02-08 DIAGNOSIS — Z7689 Persons encountering health services in other specified circumstances: Secondary | ICD-10-CM | POA: Diagnosis not present

## 2021-02-09 IMAGING — CT CT HEART MORP W/ CTA COR W/ SCORE W/ CA W/CM &/OR W/O CM
4 of 7 series · 8 of 20 positions shown, 9 images · non-contrast
Comparison: 12/03/2018 screening chest CT.

Addendum:
CLINICAL DATA: Chest pain

EXAM:
Cardiac/Coronary CTA
TECHNIQUE: The patient was scanned on a Phillips Force scanner. A 100 kV
prospective scan was triggered in the descending thoracic aorta at
111 HU's. Axial non-contrast 3 mm slices were carried out through
the heart. The data set was analyzed on a dedicated work station and
scored using the Agatson method. Gantry rotation speed was 250 msecs
and collimation was .6 mm. 5 mg IV metoprolol and 0.8 mg of sl NTG
was given. The 3D data set was reconstructed in 5% intervals of the
35-75 % of the R-R cycle. Diastolic phases were analyzed on a
dedicated work station using MPR, MIP and VRT modes. The patient
received 80 cc of contrast.

[Series 6: best diast 75 % · axial · 0.39mm/px · z∈[-232,-191]mm · 2 of 313 slices shown, 3 images]
[im 105/313  vessel]
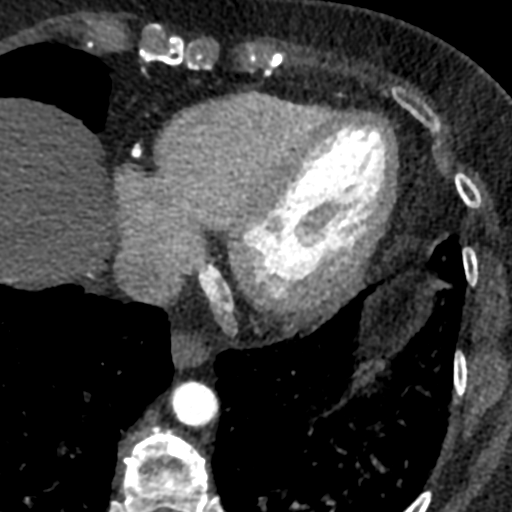
[im 105/313  lung]
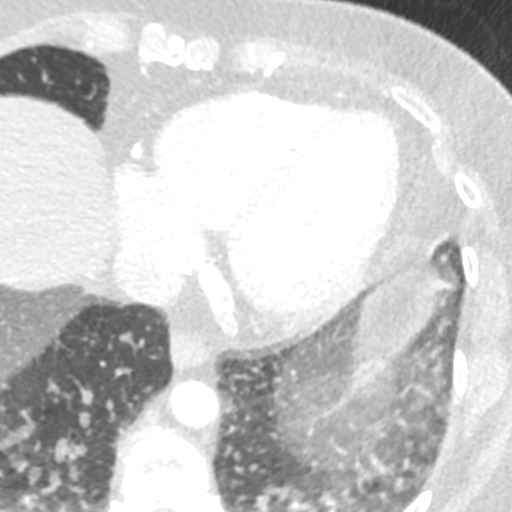
[im 209/313  vessel]
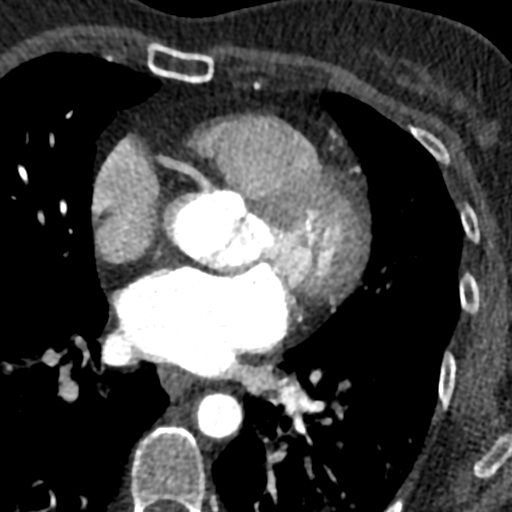

[Series 7: best syst 42 % · axial · 0.39mm/px · z∈[-232,-191]mm · 2 of 313 slices shown]
[im 105/313  vessel]
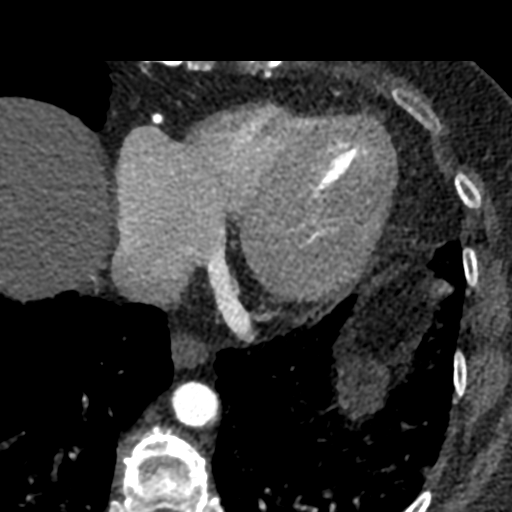
[im 209/313  vessel]
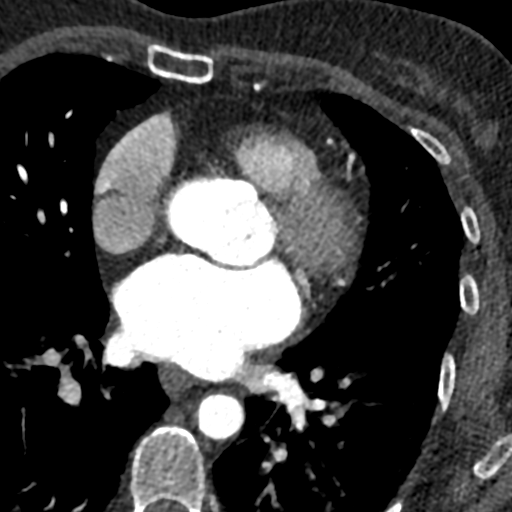

[Series 8: ts diast sharp 42 % · axial · 0.39mm/px · z∈[-232,-191]mm · 2 of 313 slices shown]
[im 105/313  lung]
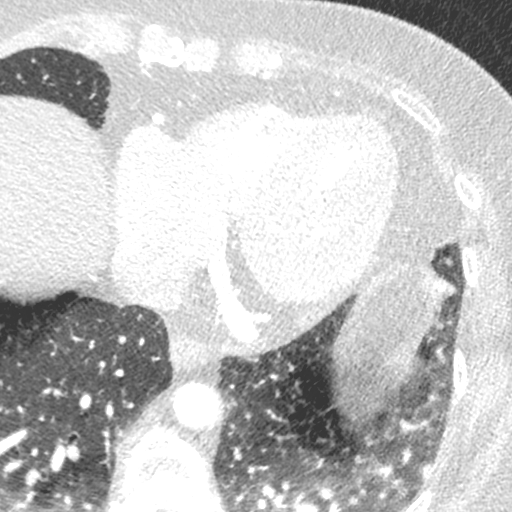
[im 209/313  lung]
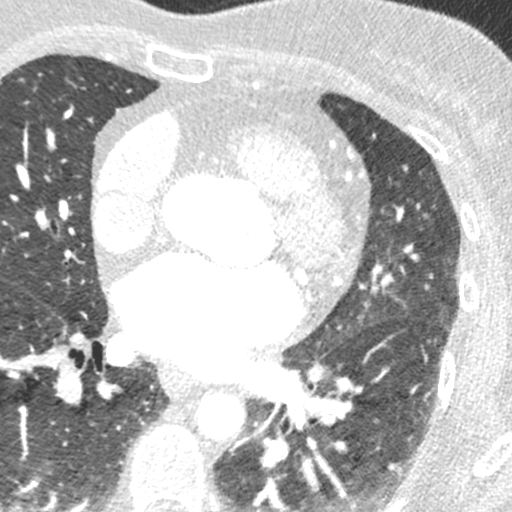

[Series 9: ts syst sharp 42 % · axial · 0.39mm/px · z∈[-232,-191]mm · 2 of 313 slices shown]
[im 105/313  lung]
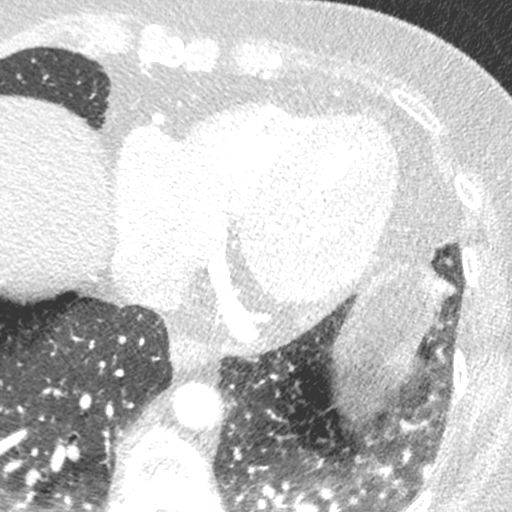
[im 209/313  lung]
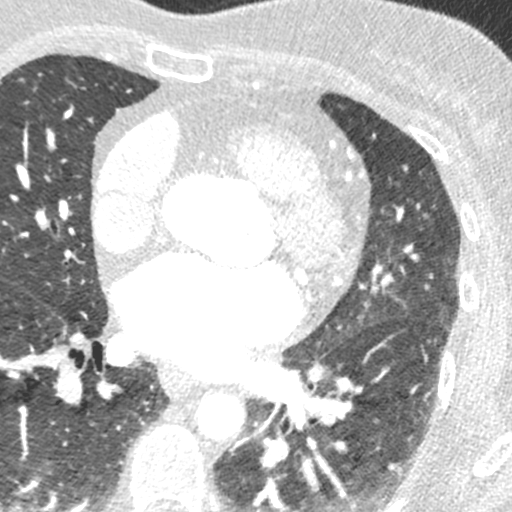

[8 of 20 positions shown; findings below may reference images not displayed]

FINDINGS: Image quality: Average.

Noise artifact is: There is significant motion artifact present.

Coronary Arteries:  Normal coronary origin.  Right dominance.

Left main: The left main is a large caliber vessel with a normal
take off from the left coronary cusp that bifurcates to form a left
anterior descending artery and a left circumflex artery. There is no
plaque or stenosis.

Left anterior descending artery: The proximal LAD contains mild
(25-49%) non-calcified plaque. The mid to distal LAD contains a
long, moderate non-calcified plaque (50-69%). There is 1 patent
diagonal branch.

Left circumflex artery: The LCX is non-dominant. The LCX is patent
without plaque or stenosis. The second OM branch is a large vessel
with mild non-calcified plaque (25-49%).

Right coronary artery: The RCA is dominant with normal take off from
the right coronary cusp. The proximal RCA is patent without plaque
or stenosis. The mid RCA contains mild non-calcified plaque
(25-49%). The distal RCA is patent. The RCA terminates as a PDA and
right posterolateral branch without evidence of plaque or stenosis.

Right Atrium: Right atrial size is within normal limits. Venous
catheter present in SVC and terminates at level of RA.

Right Ventricle: The right ventricular cavity is within normal
limits.

Left Atrium: Left atrial size is normal in size with no left atrial
appendage filling defect. Small PFO present.

Left Ventricle: The ventricular cavity size is within normal limits.
There are no stigmata of prior infarction. There is no abnormal
filling defect.

Pulmonary arteries: Normal in size without proximal filling defect.

Pulmonary veins: Normal pulmonary venous drainage.

Pericardium: Normal thickness with no significant effusion or
calcium present.

Cardiac valves: The aortic valve is bicuspid, Salvi type 0. There
is mild calcification of the AoV. The mitral valve is normal
structure without significant calcification.

Aorta: There is mild dilation of the aortic root up to 3.8 cm in
largest diameter (sinus to sinus) method. Trace aortic root
calcification. The ascending aorta measures up to 3.6 cm in the
axial plane at the level of the main pulmonary artery.

Extra-cardiac findings: See attached radiology report for
non-cardiac structures.
IMPRESSION: 1. Coronary calcium score of 0.

2. Normal coronary origin with right dominance.

3. Bicuspid aortic valve, Salvi type 0.

4. Mild aortic root dilation (up to 3.8 cm sinus to sinus).

5. Small PFO.

6. Moderate non-calcified plaque in the mid to distal LAD (50-69%).

7. Mild non-calcified plaque in the RCA/LCX (25-49%).

RECOMMENDATIONS:
1. Moderate CAD in the mid LAD. Consider symptom-guided
anti-ischemic pharmacotherapy as well as risk factor modification
per guideline directed care. Additional analysis with CT FFR will be
submitted.

EXAM:
OVER-READ INTERPRETATION  CT CHEST

The following report is an over-read performed by radiologist Dr.
does not include interpretation of cardiac or coronary anatomy or
pathology. The coronary CTA interpretation by the cardiologist is
attached.
FINDINGS: Cardiovascular: Normal heart size. No significant pericardial
effusion/thickening. Tip of superior approach central venous
catheter terminates in the lower third of the SVC. Atherosclerotic
nonaneurysmal visualized thoracic aorta. Normal caliber visualized
pulmonary arteries. No central pulmonary emboli.

Mediastinum/Nodes: Unremarkable esophagus. No pathologically
enlarged mediastinal nodes. Mildly enlarged 1.2 cm right hilar node
(series 11/image 1) is unchanged since 12/03/2018 and presumably
reactive. No additional pathologically enlarged hilar nodes.

Lungs/Pleura: No pneumothorax. No pleural effusion. No acute
consolidative airspace disease or lung masses. A few scattered small
solid left pulmonary nodules, largest 4 mm in the peripheral lingula
(series 12/image 12), all stable since 12/03/2018 screening chest
CT. No new significant pulmonary nodules.

Upper abdomen: No acute abnormality.

Musculoskeletal:  No aggressive appearing focal osseous lesions.
IMPRESSION: 1. Scattered small solid left pulmonary nodules, largest 4 mm, all
stable since 12/03/2018 screening chest CT. These nodules can be
reassessed on follow-up screening chest CT in November 2019.
2. Mildly enlarged right hilar node appears unchanged since
12/03/2018 screening chest CT, presumably benign.
3.  Aortic Atherosclerosis ([id number]).

*** End of Addendum ***
FINDINGS: Image quality: Average.

Noise artifact is: There is significant motion artifact present.

Coronary Arteries:  Normal coronary origin.  Right dominance.

Left main: The left main is a large caliber vessel with a normal
take off from the left coronary cusp that bifurcates to form a left
anterior descending artery and a left circumflex artery. There is no
plaque or stenosis.

Left anterior descending artery: The proximal LAD contains mild
(25-49%) non-calcified plaque. The mid to distal LAD contains a
long, moderate non-calcified plaque (50-69%). There is 1 patent
diagonal branch.

Left circumflex artery: The LCX is non-dominant. The LCX is patent
without plaque or stenosis. The second OM branch is a large vessel
with mild non-calcified plaque (25-49%).

Right coronary artery: The RCA is dominant with normal take off from
the right coronary cusp. The proximal RCA is patent without plaque
or stenosis. The mid RCA contains mild non-calcified plaque
(25-49%). The distal RCA is patent. The RCA terminates as a PDA and
right posterolateral branch without evidence of plaque or stenosis.

Right Atrium: Right atrial size is within normal limits. Venous
catheter present in SVC and terminates at level of RA.

Right Ventricle: The right ventricular cavity is within normal
limits.

Left Atrium: Left atrial size is normal in size with no left atrial
appendage filling defect. Small PFO present.

Left Ventricle: The ventricular cavity size is within normal limits.
There are no stigmata of prior infarction. There is no abnormal
filling defect.

Pulmonary arteries: Normal in size without proximal filling defect.

Pulmonary veins: Normal pulmonary venous drainage.

Pericardium: Normal thickness with no significant effusion or
calcium present.

Cardiac valves: The aortic valve is bicuspid, Salvi type 0. There
is mild calcification of the AoV. The mitral valve is normal
structure without significant calcification.

Aorta: There is mild dilation of the aortic root up to 3.8 cm in
largest diameter (sinus to sinus) method. Trace aortic root
calcification. The ascending aorta measures up to 3.6 cm in the
axial plane at the level of the main pulmonary artery.

Extra-cardiac findings: See attached radiology report for
non-cardiac structures.
IMPRESSION: 1. Coronary calcium score of 0.

2. Normal coronary origin with right dominance.

3. Bicuspid aortic valve, Salvi type 0.

4. Mild aortic root dilation (up to 3.8 cm sinus to sinus).

5. Small PFO.

6. Moderate non-calcified plaque in the mid to distal LAD (50-69%).

7. Mild non-calcified plaque in the RCA/LCX (25-49%).

RECOMMENDATIONS:
1. Moderate CAD in the mid LAD. Consider symptom-guided
anti-ischemic pharmacotherapy as well as risk factor modification
per guideline directed care. Additional analysis with CT FFR will be
submitted.

## 2021-02-24 ENCOUNTER — Telehealth: Payer: Self-pay | Admitting: Gastroenterology

## 2021-02-24 NOTE — Telephone Encounter (Signed)
Pt notified that the H-Pylori nor the medication caused her to have the sepsis. Pt was reminded to come by our Lab to obtain stool collection kit to test for the H -Pylori. Pt verbalized understanding with all questions answered  

## 2021-02-24 NOTE — Telephone Encounter (Signed)
Patient has questions.  She took the recommended medicine for the H-Pylori as prescribed, had terrible abdominal pain, and ended up in the hospital for a week with sepsis.  She wants to know if that was caused by the H-Pylori or by the medicine.  Please call.

## 2021-03-10 DIAGNOSIS — A419 Sepsis, unspecified organism: Secondary | ICD-10-CM | POA: Diagnosis not present

## 2021-03-10 DIAGNOSIS — Z2821 Immunization not carried out because of patient refusal: Secondary | ICD-10-CM | POA: Diagnosis not present

## 2021-03-10 DIAGNOSIS — Z789 Other specified health status: Secondary | ICD-10-CM | POA: Diagnosis not present

## 2021-03-10 DIAGNOSIS — D75838 Other thrombocytosis: Secondary | ICD-10-CM | POA: Diagnosis not present

## 2021-03-10 DIAGNOSIS — Z6829 Body mass index (BMI) 29.0-29.9, adult: Secondary | ICD-10-CM | POA: Diagnosis not present

## 2021-07-28 ENCOUNTER — Ambulatory Visit: Payer: PPO | Admitting: Gastroenterology

## 2021-08-13 ENCOUNTER — Other Ambulatory Visit (INDEPENDENT_AMBULATORY_CARE_PROVIDER_SITE_OTHER): Payer: PPO

## 2021-08-13 ENCOUNTER — Encounter: Payer: Self-pay | Admitting: Gastroenterology

## 2021-08-13 ENCOUNTER — Ambulatory Visit (INDEPENDENT_AMBULATORY_CARE_PROVIDER_SITE_OTHER): Payer: PPO | Admitting: Gastroenterology

## 2021-08-13 VITALS — BP 160/78 | HR 81 | Ht 63.0 in | Wt 172.0 lb

## 2021-08-13 DIAGNOSIS — R748 Abnormal levels of other serum enzymes: Secondary | ICD-10-CM | POA: Diagnosis not present

## 2021-08-13 DIAGNOSIS — K219 Gastro-esophageal reflux disease without esophagitis: Secondary | ICD-10-CM | POA: Diagnosis not present

## 2021-08-13 DIAGNOSIS — Z8619 Personal history of other infectious and parasitic diseases: Secondary | ICD-10-CM | POA: Diagnosis not present

## 2021-08-13 LAB — CBC WITH DIFFERENTIAL/PLATELET
Basophils Absolute: 0.1 10*3/uL (ref 0.0–0.1)
Basophils Relative: 0.9 % (ref 0.0–3.0)
Eosinophils Absolute: 0.6 10*3/uL (ref 0.0–0.7)
Eosinophils Relative: 7.1 % — ABNORMAL HIGH (ref 0.0–5.0)
HCT: 31.7 % — ABNORMAL LOW (ref 36.0–46.0)
Hemoglobin: 10.4 g/dL — ABNORMAL LOW (ref 12.0–15.0)
Lymphocytes Relative: 22 % (ref 12.0–46.0)
Lymphs Abs: 2 10*3/uL (ref 0.7–4.0)
MCHC: 32.8 g/dL (ref 30.0–36.0)
MCV: 84.8 fl (ref 78.0–100.0)
Monocytes Absolute: 0.7 10*3/uL (ref 0.1–1.0)
Monocytes Relative: 8.3 % (ref 3.0–12.0)
Neutro Abs: 5.5 10*3/uL (ref 1.4–7.7)
Neutrophils Relative %: 61.7 % (ref 43.0–77.0)
Platelets: 383 10*3/uL (ref 150.0–400.0)
RBC: 3.74 Mil/uL — ABNORMAL LOW (ref 3.87–5.11)
RDW: 14.1 % (ref 11.5–15.5)
WBC: 8.9 10*3/uL (ref 4.0–10.5)

## 2021-08-13 LAB — COMPREHENSIVE METABOLIC PANEL
ALT: 8 U/L (ref 0–35)
AST: 9 U/L (ref 0–37)
Albumin: 4 g/dL (ref 3.5–5.2)
Alkaline Phosphatase: 86 U/L (ref 39–117)
BUN: 12 mg/dL (ref 6–23)
CO2: 31 mEq/L (ref 19–32)
Calcium: 9.6 mg/dL (ref 8.4–10.5)
Chloride: 101 mEq/L (ref 96–112)
Creatinine, Ser: 1.08 mg/dL (ref 0.40–1.20)
GFR: 51.69 mL/min — ABNORMAL LOW (ref >60.00)
Glucose, Bld: 102 mg/dL — ABNORMAL HIGH (ref 70–99)
Potassium: 3.7 mEq/L (ref 3.5–5.1)
Sodium: 140 mEq/L (ref 135–145)
Total Bilirubin: 0.3 mg/dL (ref 0.2–1.2)
Total Protein: 7.1 g/dL (ref 6.0–8.3)

## 2021-08-13 LAB — LIPASE: Lipase: 14 U/L (ref 11.0–59.0)

## 2021-08-13 NOTE — Patient Instructions (Addendum)
If you are age 72 or older, your body mass index should be between 23-30. Your Body mass index is 30.47 kg/m. If this is out of the aforementioned range listed, please consider follow up with your Primary Care Provider.  If you are age 66 or younger, your body mass index should be between 19-25. Your Body mass index is 30.47 kg/m. If this is out of the aformentioned range listed, please consider follow up with your Primary Care Provider.   ________________________________________________________  The [facility] providers would like to encourage you to use [facility] to communicate with providers for non-urgent requests or questions.  Due to long hold times on the telephone, sending your provider a message by [facility] may be a faster and more efficient way to get a response.  Please allow 48 business hours for a response.  Please remember that this is for non-urgent requests.  _______________________________________________________  Your provider has requested that you go to the basement level for lab work before leaving today. Press "B" on the elevator. The lab is located at the first door on the left as you exit the elevator.  Continue Protonix  Call with any questions or concerns.  Thank you,  Dr. Jackquline Denmark

## 2021-08-13 NOTE — Progress Notes (Signed)
Chief Complaint:   Referring Provider:  Serita Grammes, MD      ASSESSMENT AND PLAN;   #1. Abn LFTs (resolved) ?d/t sepsis.  #2. GERD with erosive esophagitis.  #3.  H/O colon polyps.  Next colon due 11/2023   Plan: - CBC, CMP, lipase today - Continue protonix 65m po QD #90, 4 RF - Call if any problems.  She was given all contact numbers.     HPI:    Rachel Massarois a 71y.o. female  With H/O breast CA, panic disorder, anxiety/depression, CAD  [facility] with nausea Found to have abn LFTs (AST/ALT/AP- 283/88/280) with Nl TB CT AP with contrast showed possible stranding around the gallbladder Ultrasound was negative for cholelithiasis. Dr. LNoberto Retortfrom surgery was consulted.  No need for lap chole Abnormal LFTs were thought to be due to sepsis. Dx with Klebsiella pneumoniae.  Treated with antibiotics.  Here for follow-up visit  Feels 100% better.  Repeat LFTs were all normal with AST 14, ALT 14, alk phos 155 02/08/2021  No further abdominal pain  She has quit smoking.  No sodas, chocolates, chewing gums, artificial sweeteners and candy. No NSAIDs   Past GI procedures:  EGD 11/2020 - LA Grade A reflux esophagitis with no bleeding. Bx-consistent with reflux. - HP Gastritis.  Treated - Antral nodule vs inflammatory polyp. Bx- neg - neg SB bX for celiac  Colon 11/2020 - Colonic polyps s/p polypectomy. Bx-tubular adenoma, SSA - Moderate sigmoid diverticulosis. - Otherwise normal colonoscopy to TI. - S/P Random colon biopsies-negative for microscopic colitis. - Repeat in 3 years.  [place] -Marked GB wall thickening without gallstones or Murphy's. -No intra or extrahepatic biliary ductal dilatation. -No gallstones -Normal liver.  CT AP with contrast 01/26/2021 -Mild haziness of gallbladder wall. -Sigmoid diverticulosis without diverticulitis.  Colonoscopy 06/2016 (PCF-some difficulty through sigmoid colon) -Colonic  polyps (10)s/p polypectomy. Bx- TA.  Recommended to repeat in 3 years. -Moderate predominantly sigmoid diverticulosis. Past Medical History:  Diagnosis Date   ARMD (age-related macular degeneration), bilateral 10/18/2017   Breast cancer ([place] 2009   CAD (coronary artery disease) 02/22/2019   Cardiac murmur 11/06/2018   Chest discomfort 11/06/2018   Gastroesophageal reflux disease without esophagitis    Genetic testing 06/23/2014   Negative genetic testing on the Breast/Ovarian Cancer panel.  The Breast/Ovarian gene panel offered by [facility] includes sequencing and rearrangement analysis for the following 21 genes:  ATM, BARD1, BRCA1, BRCA2, BRIP1, CDH1, CHEK2, EPCAM, FANCC, MLH1, MSH2, MSH6, NBN, PALB2, PMS2, PTEN, RAD51C, RAD51D, STK11, TP53, and XRCC2.   The report date is June 22, 2014.    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 Modetate predominantly sigmoid diverticulosis   DESCEMETS STRIPPING AUTOMATED ENDOTHELIAL KERATOPLASTY Right 08/31/2004   Dr. Helene Shoe   DESCEMETS STRIPPING AUTOMATED ENDOTHELIAL KERATOPLASTY Left 12/14/2004   Dr. Helene Shoe     Family History  Problem Relation Age of Onset   Dementia Mother    Stroke Father    Ovarian cancer Sister 24   Alzheimer's disease Sister    Stroke Maternal Grandmother    Colon cancer Paternal Grandmother 38   Breast cancer Maternal Aunt        dx in her 86s   Colon cancer Maternal Uncle        dx in late 50s   Ovarian cancer Paternal Aunt        dx in her 86s   Melanoma Paternal Uncle    Brain cancer Paternal Uncle    Breast cancer Cousin 20   Ovarian cancer Cousin        dx in her 31s   Pancreatic cancer Neg Hx    Throat cancer Neg Hx    Liver disease Neg Hx    Stomach cancer Neg Hx     Social History   Tobacco Use   Smoking status: Every Day    Packs/day: 1.00    Years: 30.00    Pack years: 30.00    Types: Cigarettes   Smokeless tobacco: Never  Vaping Use   Vaping Use: Never used  Substance Use Topics   Alcohol use: No   Drug use: No    Current Outpatient Medications  Medication Sig Dispense Refill   Calcium-Phosphorus-Vitamin D (CALCIUM GUMMIES PO) Take by mouth daily.     Cholecalciferol (VITAMIN D3 GUMMIES) 25 MCG (1000 UT) CHEW 2 capsules daily.     DULoxetine (CYMBALTA) 30 MG capsule 1 capsule by mouth daily at PM     mirtazapine (REMERON) 15 MG tablet 1/2 OF A tablet by mouth at bedtime     Multiple Vitamins-Minerals (AIRBORNE PO) Take by mouth.     pantoprazole (PROTONIX) 40 MG tablet Take 40 mg by mouth daily.     rosuvastatin (CRESTOR) 40 MG tablet Take 0.5 tablets (20 mg total) by mouth daily. TK 1 T PO D 30 tablet 4   zoledronic acid (RECLAST) 5 MG/100ML SOLN injection Inject 5 mg into the vein once.     No current facility-administered medications for this visit.    No Known Allergies  Review of Systems:  Constitutional: Denies fever, chills, diaphoresis, appetite change and has fatigue.  HEENT: Denies photophobia, eye pain, redness, hearing loss, ear pain, congestion, sore throat, rhinorrhea, sneezing, mouth sores, neck pain, neck  stiffness and tinnitus.   Respiratory: Denies SOB, DOE, cough, chest tightness,  and wheezing.   Cardiovascular: Denies chest pain, palpitations and leg swelling.  Genitourinary: Denies dysuria, urgency, frequency, hematuria, flank pain and difficulty urinating.  Musculoskeletal: has myalgias, back pain, joint swelling, arthralgias and gait problem.  Skin: No rash.  Neurological: Denies dizziness, seizures, syncope, weakness, light-headedness, numbness and headaches.  Hematological: Denies adenopathy. Easy bruising, personal or family bleeding history  Psychiatric/Behavioral: Has anxiety or depression     Physical Exam:    There were no vitals taken for this visit. Wt Readings from Last 3 Encounters:  12/17/20 164 lb (74.4 kg)  10/28/20 164 lb (74.4 kg)  02/22/19 162 lb 12.8 oz (73.8 kg)   Constitutional:  Well-developed, in no acute distress. Psychiatric: Normal  mood and affect. Behavior is normal. HEENT: Pupils normal.  Conjunctivae are normal. No scleral icterus. Cardiovascular: Normal rate, regular rhythm. No edema Pulmonary/chest: Effort normal and breath sounds-decreased bilaterally. No wheezing, rales or rhonchi. Abdominal: Soft, nondistended. Nontender. Bowel sounds active throughout. There are no masses palpable. No hepatomegaly. Rectal: Deferred Neurological: Alert and oriented to person place and time. Skin: Skin is warm and dry. No rashes noted.  Data Reviewed: I have personally reviewed following labs and imaging studies  CBC:     View : No data to display.          CMP:    Latest Ref Rng & Units 04/03/2019   10:15 AM 02/08/2019    9:48 AM  CMP  Glucose 65 - 99 mg/dL  106    BUN 8 - 27 mg/dL  13    Creatinine 0.57 - 1.00 mg/dL  0.81    Sodium 134 - 144 mmol/L  142    Potassium 3.5 - 5.2 mmol/L  4.0    Chloride 96 - 106 mmol/L  104    CO2 20 - 29 mmol/L  25    Calcium 8.7 - 10.3 mg/dL  9.6    Total Protein 6.0 - 8.5 g/dL 6.6   7.0    Total Bilirubin  0.0 - 1.2 mg/dL 0.2   0.2    Alkaline Phos 39 - 117 IU/L 128   136    AST 0 - 40 IU/L 12   12    ALT 0 - 32 IU/L 11   10          Carmell Austria, MD 08/13/2021, 4:06 PM  Cc: Serita Grammes, MD

## 2021-08-17 ENCOUNTER — Other Ambulatory Visit: Payer: Self-pay

## 2021-08-17 DIAGNOSIS — D649 Anemia, unspecified: Secondary | ICD-10-CM

## 2021-08-17 DIAGNOSIS — K219 Gastro-esophageal reflux disease without esophagitis: Secondary | ICD-10-CM

## 2021-08-17 DIAGNOSIS — Z8619 Personal history of other infectious and parasitic diseases: Secondary | ICD-10-CM

## 2021-09-21 ENCOUNTER — Other Ambulatory Visit: Payer: Self-pay | Admitting: Gastroenterology

## 2021-12-14 ENCOUNTER — Ambulatory Visit: Payer: PPO | Admitting: Podiatry

## 2021-12-14 ENCOUNTER — Ambulatory Visit: Payer: PPO

## 2021-12-14 DIAGNOSIS — M21619 Bunion of unspecified foot: Secondary | ICD-10-CM

## 2021-12-14 DIAGNOSIS — M205X2 Other deformities of toe(s) (acquired), left foot: Secondary | ICD-10-CM | POA: Diagnosis not present

## 2021-12-14 NOTE — Progress Notes (Unsigned)
Subjective:  Patient ID: Rachel Mcgee, female    DOB: [birth year],  MRN: [id number]  Chief Complaint  Patient presents with   Bunions    left foot bunion pain    71 y.o. female presents with pain in her left forefoot at the site of her bunion.  She says this has been present for years.  Has slowly gotten worse.  She does have pain with ambulation and with wearing certain types of shoes.  Has pain with motion in the great toe joint on the left.  Past Medical History:  Diagnosis Date   ARMD (age-related macular degeneration), bilateral 10/18/2017   Breast cancer ([place]) 2009   CAD (coronary artery disease) 02/22/2019   Cardiac murmur 11/06/2018   Chest discomfort 11/06/2018   Gastroesophageal reflux disease without esophagitis    Genetic testing 06/23/2014   Negative genetic testing on the Breast/Ovarian Cancer panel.  The Breast/Ovarian gene panel offered by [facility] includes sequencing and rearrangement analysis for the following 21 genes:  ATM, BARD1, BRCA1, BRCA2, BRIP1, CDH1, CHEK2, EPCAM, FANCC, MLH1, MSH2, MSH6, NBN, PALB2, PMS2, PTEN, RAD51C, RAD51D, STK11, TP53, and XRCC2.   The report date is June 22, 2014.    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   Mixed dyslipidemia 11/06/2018   Nonspecific abnormal electrocardiogram (ECG) (EKG) 11/06/2018   Osteoporosis, postmenopausal    Recurrent major depression in partial remission ([place])    S/P cataract extraction and insertion of intraocular lens 05/30/2017   OS 05/30/17 OD 06/13/17   S/P eye surgery 05/08/2017   2006   S/P YAG capsulotomy 11/28/2017   Tobacco abuse    Vitamin D deficiency     No Known Allergies  ROS: Negative except as per HPI above  Objective:  General: AAO x3, NAD  Dermatological: With inspection and palpation of the right and left lower extremities there are no open sores, no preulcerative lesions,  no rash or signs of infection present. Nails are of normal length thickness and coloration.   Vascular:  Dorsalis Pedis artery and Posterior Tibial artery pedal pulses are 2/4 bilateral.  Capillary fill time brisk < 3 sec. Pedal hair growth present. No varicosities and no lower extremity edema present bilateral. There is no pain with calf compression, swelling, warmth, erythema.   Neruologic: Grossly intact via light touch bilateral. Protective threshold intact to all sites bilateral. Patellar and Achilles deep tendon reflexes 2+ bilateral. Negative Babinski reflex.   Musculoskeletal: Left foot palpable osseous prominence located at the medial aspect of the first metatarsal head consistent with a large medial eminence.  Decreased dorsiflexion and plantarflexion range of motion of the first MPJ.  There is pain with end dorsiflexion range of motion.  Hallux abductovalgus deformity is present.  No gross hypermobility of the left first tarsometatarsal joint.  Gait: Unassisted, Nonantalgic.   No images are attached to the encounter.  Radiographs:  Date: December 14, 2021 XR the left foot Weightbearing AP/Lateral/Oblique   Findings: degenerative changes of the first metatarsophalangeal joint hallux abductovalgus with increased first metatarsal angle.  Large medial eminence of the first metatarsal head with cystic changes there.  Irregularity of the first MPJ.  Dorsal osteophyte present on the first metatarsal head. Assessment:   1. Hallux limitus, left   2. Bunion      Plan:  Patient was evaluated and treated and all questions answered.  #Hallux limitus and bunion  deformity left foot -Discussed with the patient I believe she has both a bunion as well as some evidence of osteoarthritic changes at the first metatarsophalangeal joint. -Patient patient states that she has had a injection of steroid into the left first MPJ prior.  Though I do not see record of this she says that it helped a lot  with her pain.  We will perform another one for her today per her request.  Advised that this will reduce some of her pain inflammation but probably not be a long-term solution. -Perform steroid injection after alcohol prep of the dorsal aspect of the first metatarsophalangeal joint I injected a mix of 1 cc half percent Marcaine plain with 1 cc Kenalog 10 into and about the first metatarsophalangeal joint and applied a Band-Aid following.  Patient tolerated well. -Discussed surgical correction would involve either bunion correction versus more likely need for fusion of the first metatarsal phalangeal joint.  Patient would like to defer on this for now and continue with steroid injections.  She will return if the steroid does not fully relieve her symptoms.  Return if symptoms worsen or fail to improve.          Everitt Amber, DPM [facility]

## 2022-01-14 ENCOUNTER — Ambulatory Visit: Payer: PPO | Admitting: Podiatry

## 2022-01-14 DIAGNOSIS — M205X2 Other deformities of toe(s) (acquired), left foot: Secondary | ICD-10-CM | POA: Diagnosis not present

## 2022-01-14 DIAGNOSIS — M21619 Bunion of unspecified foot: Secondary | ICD-10-CM | POA: Diagnosis not present

## 2022-01-14 NOTE — Progress Notes (Unsigned)
Subjective:  Patient ID: Rachel Mcgee, female    DOB: 1950/06/11,  MRN: [id number]  Chief Complaint  Patient presents with   Bunions    Left foot bunion pain. Patient received a steroid injection to feet September 2023 and it was not effective.     71 y.o. female presents with pain in her left forefoot at the site of her bunion.  She says this has been present for years.  Has slowly gotten worse.  She does have pain with ambulation and with wearing certain types of shoes.  Has pain with motion in the great toe joint on the left. She had steroid injection at last visit to left foot 1st MPJ and says it did not provide any relief of pain. Here to discuss further options.   Past Medical History:  Diagnosis Date   ARMD (age-related macular degeneration), bilateral 10/18/2017   Breast cancer ([place]) 2009   CAD (coronary artery disease) 02/22/2019   Cardiac murmur 11/06/2018   Chest discomfort 11/06/2018   Gastroesophageal reflux disease without esophagitis    Genetic testing 06/23/2014   Negative genetic testing on the Breast/Ovarian Cancer panel.  The Breast/Ovarian gene panel offered by [facility] includes sequencing and rearrangement analysis for the following 21 genes:  ATM, BARD1, BRCA1, BRCA2, BRIP1, CDH1, CHEK2, EPCAM, FANCC, MLH1, MSH2, MSH6, NBN, PALB2, PMS2, PTEN, RAD51C, RAD51D, STK11, TP53, and XRCC2.   The report date is June 22, 2014.    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   Mixed dyslipidemia 11/06/2018   Nonspecific abnormal electrocardiogram (ECG) (EKG) 11/06/2018   Osteoporosis, postmenopausal    Recurrent major depression in partial remission ([place])    S/P cataract extraction and insertion of intraocular lens 05/30/2017   OS 05/30/17 OD 06/13/17   S/P eye surgery 05/08/2017   2006   S/P YAG capsulotomy 11/28/2017   Tobacco abuse    Vitamin D deficiency     No  Known Allergies  ROS: Negative except as per HPI above  Objective:  General: AAO x3, NAD  Dermatological: With inspection and palpation of the right and left lower extremities there are no open sores, no preulcerative lesions, no rash or signs of infection present. Nails are of normal length thickness and coloration.   Vascular:  Dorsalis Pedis artery and Posterior Tibial artery pedal pulses are 2/4 bilateral.  Capillary fill time brisk < 3 sec. Pedal hair growth present. No varicosities and no lower extremity edema present bilateral. There is no pain with calf compression, swelling, warmth, erythema.   Neruologic: Grossly intact via light touch bilateral. Protective threshold intact to all sites bilateral. Patellar and Achilles deep tendon reflexes 2+ bilateral. Negative Babinski reflex.   Musculoskeletal: Left foot palpable osseous prominence located at the medial aspect of the first metatarsal head consistent with a large medial eminence.  Decreased dorsiflexion and plantarflexion range of motion of the first MPJ.  There is pain with end dorsiflexion range of motion.  Hallux abductovalgus deformity is present.  No gross hypermobility of the left first tarsometatarsal joint.  Gait: Unassisted, Nonantalgic.   No images are attached to the encounter.  Radiographs:  Date: December 14, 2021 XR the left foot Weightbearing AP/Lateral/Oblique   Findings: degenerative changes of the first metatarsophalangeal joint hallux abductovalgus with increased first metatarsal angle.  Large medial eminence of the first metatarsal head with cystic changes there.  Irregularity of the  first MPJ.  Dorsal osteophyte present on the first metatarsal head. Assessment:   1. Hallux limitus, left   2. Bunion      Plan:  Patient was evaluated and treated and all questions answered.  #Hallux limitus and bunion deformity left foot -Discussed with the patient I believe she has both a bunion as well as some  evidence of osteoarthritic changes at the first metatarsophalangeal joint. -We discussed the etiology and treatment options for hallux limitus / rigidus including surgical and non surgical treatment options. We discussed shoe gear changes, orthotics, injection therapy. We also discussed cheilectomy, 1st MTP arthrodesis, and arthroplasty with implants and the rationale for all of these. We discussed that each has advantages/disadvantages, and risks/benefits. She is interested in surgical correction as non surgical treatment has not been successful thus far. I recommended the following procedure:  - Recommend 1st MPJ arthrodesis left foot -We will begin surgical planning at this time.  I discussed with the patient and the risk alternative and possible complications associate with the procedure as well as the expected postoperative recovery course.  She is electing to proceed and surgical consent was obtained at this visit.   Return for After OR.          Everitt Amber, DPM [facility]

## 2022-01-18 ENCOUNTER — Telehealth: Payer: Self-pay

## 2022-01-18 NOTE — Telephone Encounter (Signed)
Received surgery paperwork from the [facility] office. Left a message for Rachel Mcgee to call and schedule surgery with Dr. Loel Lofty.

## 2022-03-24 ENCOUNTER — Telehealth: Payer: Self-pay | Admitting: Gastroenterology

## 2022-03-24 MED ORDER — PANTOPRAZOLE SODIUM 40 MG PO TBEC: DELAYED_RELEASE_TABLET | ORAL | 4 refills | Status: AC

## 2022-03-24 NOTE — Telephone Encounter (Signed)
Inbound call from patient requesting a refill for Protonix. Requesting refill be sent to [facility] on [address] in [place]. Please advise.

## 2022-03-24 NOTE — Telephone Encounter (Signed)
Done

## 2023-08-03 DIAGNOSIS — H1010 Acute atopic conjunctivitis, unspecified eye: Secondary | ICD-10-CM | POA: Diagnosis not present

## 2023-08-03 DIAGNOSIS — J309 Allergic rhinitis, unspecified: Secondary | ICD-10-CM | POA: Diagnosis not present

## 2023-08-03 DIAGNOSIS — Z6831 Body mass index (BMI) 31.0-31.9, adult: Secondary | ICD-10-CM | POA: Diagnosis not present

## 2023-12-18 DIAGNOSIS — E1169 Type 2 diabetes mellitus with other specified complication: Secondary | ICD-10-CM | POA: Diagnosis not present

## 2023-12-18 DIAGNOSIS — M0520 Rheumatoid vasculitis with rheumatoid arthritis of unspecified site: Secondary | ICD-10-CM | POA: Diagnosis not present

## 2023-12-18 DIAGNOSIS — Z1331 Encounter for screening for depression: Secondary | ICD-10-CM | POA: Diagnosis not present

## 2023-12-18 DIAGNOSIS — Z0000 Encounter for general adult medical examination without abnormal findings: Secondary | ICD-10-CM | POA: Diagnosis not present

## 2023-12-18 DIAGNOSIS — L259 Unspecified contact dermatitis, unspecified cause: Secondary | ICD-10-CM | POA: Diagnosis not present

## 2023-12-18 DIAGNOSIS — S72413A Displaced unspecified condyle fracture of lower end of unspecified femur, initial encounter for closed fracture: Secondary | ICD-10-CM | POA: Diagnosis not present

## 2023-12-18 DIAGNOSIS — E785 Hyperlipidemia, unspecified: Secondary | ICD-10-CM | POA: Diagnosis not present

## 2023-12-18 DIAGNOSIS — Z23 Encounter for immunization: Secondary | ICD-10-CM | POA: Diagnosis not present

## 2023-12-18 DIAGNOSIS — Z6831 Body mass index (BMI) 31.0-31.9, adult: Secondary | ICD-10-CM | POA: Diagnosis not present

## 2023-12-18 DIAGNOSIS — Z853 Personal history of malignant neoplasm of breast: Secondary | ICD-10-CM | POA: Diagnosis not present

## 2023-12-18 DIAGNOSIS — Z1339 Encounter for screening examination for other mental health and behavioral disorders: Secondary | ICD-10-CM | POA: Diagnosis not present

## 2024-02-17 ENCOUNTER — Encounter: Payer: Self-pay | Admitting: Gastroenterology

## 2024-03-26 NOTE — Progress Notes (Signed)
 Shayla Heming                                          MRN: [id number]   03/26/2024   The [facility] Quality Team Specialist reviewed this patient medical record for the purposes of chart review for care gap closure. The following were reviewed: chart review for care gap closure-kidney health evaluation for diabetes:eGFR  and uACR.    [facility] Quality Team
